# Patient Record
Sex: Male | Born: 1970
Health system: Southern US, Community
[De-identification: ages and names within clinical notes are randomized; demographics above are authoritative.]

## PROBLEM LIST (undated history)

## (undated) DIAGNOSIS — M1990 Unspecified osteoarthritis, unspecified site: Secondary | ICD-10-CM

## (undated) DIAGNOSIS — F411 Generalized anxiety disorder: Secondary | ICD-10-CM

## (undated) DIAGNOSIS — F1010 Alcohol abuse, uncomplicated: Secondary | ICD-10-CM

## (undated) DIAGNOSIS — K219 Gastro-esophageal reflux disease without esophagitis: Secondary | ICD-10-CM

## (undated) DIAGNOSIS — F32A Depression, unspecified: Secondary | ICD-10-CM

## (undated) DIAGNOSIS — F329 Major depressive disorder, single episode, unspecified: Secondary | ICD-10-CM

## (undated) HISTORY — DX: Generalized anxiety disorder: F41.1

## (undated) HISTORY — DX: Major depressive disorder, single episode, unspecified: F32.9

## (undated) HISTORY — DX: Unspecified osteoarthritis, unspecified site: M19.90

## (undated) HISTORY — PX: KNEE SURGERY: SHX244

## (undated) HISTORY — DX: Gastro-esophageal reflux disease without esophagitis: K21.9

## (undated) HISTORY — DX: Depression, unspecified: F32.A

## (undated) HISTORY — DX: Alcohol abuse, uncomplicated: F10.10

---

## 1998-05-22 ENCOUNTER — Emergency Department (HOSPITAL_COMMUNITY): Admission: EM | Admit: 1998-05-22 | Discharge: 1998-05-22 | Payer: Self-pay | Admitting: Emergency Medicine

## 1998-05-22 ENCOUNTER — Encounter: Payer: Self-pay | Admitting: Emergency Medicine

## 2000-05-20 ENCOUNTER — Emergency Department (HOSPITAL_COMMUNITY): Admission: EM | Admit: 2000-05-20 | Discharge: 2000-05-20 | Payer: Self-pay | Admitting: Emergency Medicine

## 2000-05-23 ENCOUNTER — Emergency Department (HOSPITAL_COMMUNITY): Admission: EM | Admit: 2000-05-23 | Discharge: 2000-05-23 | Payer: Self-pay | Admitting: Emergency Medicine

## 2000-05-30 ENCOUNTER — Emergency Department (HOSPITAL_COMMUNITY): Admission: EM | Admit: 2000-05-30 | Discharge: 2000-05-30 | Payer: Self-pay | Admitting: *Deleted

## 2002-09-16 ENCOUNTER — Emergency Department (HOSPITAL_COMMUNITY): Admission: EM | Admit: 2002-09-16 | Discharge: 2002-09-17 | Payer: Self-pay | Admitting: Emergency Medicine

## 2002-09-17 ENCOUNTER — Encounter: Payer: Self-pay | Admitting: Orthopedic Surgery

## 2002-09-20 ENCOUNTER — Encounter: Payer: Self-pay | Admitting: Orthopedic Surgery

## 2002-09-20 ENCOUNTER — Ambulatory Visit (HOSPITAL_BASED_OUTPATIENT_CLINIC_OR_DEPARTMENT_OTHER): Admission: RE | Admit: 2002-09-20 | Discharge: 2002-09-21 | Payer: Self-pay | Admitting: Orthopedic Surgery

## 2014-08-07 ENCOUNTER — Telehealth: Payer: Self-pay | Admitting: Behavioral Health

## 2014-08-07 NOTE — Telephone Encounter (Signed)
Unable to reach patient at time of Pre-Visit Call.  Left message for patient to return call when available.    

## 2014-08-08 ENCOUNTER — Encounter: Payer: Self-pay | Admitting: Medical

## 2014-08-08 ENCOUNTER — Ambulatory Visit (HOSPITAL_BASED_OUTPATIENT_CLINIC_OR_DEPARTMENT_OTHER)
Admission: RE | Admit: 2014-08-08 | Discharge: 2014-08-08 | Disposition: A | Discharge status: Home / Self Care | Payer: BLUE CROSS/BLUE SHIELD | Source: Ambulatory Visit | Attending: Medical | Admitting: Medical

## 2014-08-08 ENCOUNTER — Ambulatory Visit (INDEPENDENT_AMBULATORY_CARE_PROVIDER_SITE_OTHER): Payer: BLUE CROSS/BLUE SHIELD | Admitting: Medical

## 2014-08-08 ENCOUNTER — Telehealth: Payer: Self-pay | Admitting: *Deleted

## 2014-08-08 VITALS — BP 130/89 | HR 73 | Temp 97.7°F | Ht 72.5 in | Wt 192.6 lb

## 2014-08-08 DIAGNOSIS — R55 Syncope and collapse: Secondary | ICD-10-CM | POA: Insufficient documentation

## 2014-08-08 DIAGNOSIS — R5383 Other fatigue: Secondary | ICD-10-CM

## 2014-08-08 DIAGNOSIS — R0602 Shortness of breath: Secondary | ICD-10-CM | POA: Insufficient documentation

## 2014-08-08 DIAGNOSIS — R42 Dizziness and giddiness: Secondary | ICD-10-CM | POA: Diagnosis not present

## 2014-08-08 DIAGNOSIS — R0600 Dyspnea, unspecified: Secondary | ICD-10-CM

## 2014-08-08 DIAGNOSIS — F411 Generalized anxiety disorder: Secondary | ICD-10-CM

## 2014-08-08 DIAGNOSIS — R51 Headache: Secondary | ICD-10-CM

## 2014-08-08 DIAGNOSIS — R519 Headache, unspecified: Secondary | ICD-10-CM

## 2014-08-08 DIAGNOSIS — F1010 Alcohol abuse, uncomplicated: Secondary | ICD-10-CM | POA: Diagnosis not present

## 2014-08-08 HISTORY — DX: Alcohol abuse, uncomplicated: F10.10

## 2014-08-08 LAB — COMPREHENSIVE METABOLIC PANEL
ALBUMIN: 4.4 g/dL (ref 3.5–5.2)
ALT: 30 U/L (ref 0–53)
AST: 30 U/L (ref 0–37)
Alkaline Phosphatase: 67 U/L (ref 39–117)
BUN: 16 mg/dL (ref 6–23)
CHLORIDE: 105 meq/L (ref 96–112)
CO2: 26 mEq/L (ref 19–32)
Calcium: 9.6 mg/dL (ref 8.4–10.5)
Creatinine, Ser: 1.33 mg/dL (ref 0.40–1.50)
GFR: 61.6 mL/min (ref >60.00)
Glucose, Bld: 81 mg/dL (ref 70–99)
Potassium: 4.3 mEq/L (ref 3.5–5.1)
Sodium: 138 mEq/L (ref 135–145)
Total Bilirubin: 0.4 mg/dL (ref 0.2–1.2)
Total Protein: 7.3 g/dL (ref 6.0–8.3)

## 2014-08-08 LAB — CBC WITH DIFFERENTIAL/PLATELET
Basophils Absolute: 0 10*3/uL (ref 0.0–0.1)
Basophils Relative: 0.5 % (ref 0.0–3.0)
EOS ABS: 0.4 10*3/uL (ref 0.0–0.7)
EOS PCT: 4 % (ref 0.0–5.0)
HCT: 44.8 % (ref 39.0–52.0)
HEMOGLOBIN: 15.4 g/dL (ref 13.0–17.0)
Lymphocytes Relative: 23.3 % (ref 12.0–46.0)
Lymphs Abs: 2.2 10*3/uL (ref 0.7–4.0)
MCHC: 34.3 g/dL (ref 30.0–36.0)
MCV: 94.6 fl (ref 78.0–100.0)
MONOS PCT: 7.7 % (ref 3.0–12.0)
Monocytes Absolute: 0.7 10*3/uL (ref 0.1–1.0)
NEUTROS PCT: 64.5 % (ref 43.0–77.0)
Neutro Abs: 6 10*3/uL (ref 1.4–7.7)
Platelets: 246 10*3/uL (ref 150.0–400.0)
RBC: 4.73 Mil/uL (ref 4.22–5.81)
RDW: 13.2 % (ref 11.5–15.5)
WBC: 9.3 10*3/uL (ref 4.0–10.5)

## 2014-08-08 LAB — GAMMA GT: GGT: 52 U/L — AB (ref 7–51)

## 2014-08-08 LAB — TSH: TSH: 1.77 u[IU]/mL (ref 0.35–4.50)

## 2014-08-08 LAB — D-DIMER, QUANTITATIVE: D-Dimer, Quant: 0.27 ug/mL-FEU (ref 0.00–0.48)

## 2014-08-08 MED ORDER — VENLAFAXINE HCL ER 75 MG PO CP24: 75.0000 mg | ORAL_CAPSULE | Freq: Every day | ORAL | Status: DC

## 2014-08-08 NOTE — Assessment & Plan Note (Signed)
effexor rx 

## 2014-08-08 NOTE — Patient Instructions (Addendum)
Syncope 3 wks ago cardio and neuro referal. Get ekg today.  If get again then ED evaluation immediatlely.  Dizziness and giddiness Chronic/not new. Mild worse since syncope. In light of recent worsening, with ha and then syncope. Will try to get CT of head stat no contrast.  Headache Mild to moderate daily since syncope. Whereas in past was just twice a week. So will try to get ct of head stat.  Fatigue Cbc, cmp, tsh.  Dyspnea Since syncope. Will get cxr and d-dimer stat. If Positive may get ct of chest angio  Alcohol abuse Will get cmp and ggt  Generalized anxiety disorder effexor rx.    Follow up in one week or as needed

## 2014-08-08 NOTE — Progress Notes (Signed)
Subjective:    Patient ID: Christopher Pace, male    DOB: 08-03-1970, 44 y.o.   MRN: [id number]  HPI  I have reviewed pt PMH, PSH, FH, Social History and Surgical History.  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   Pt in states going through some MD recently. His former MD moved. Then his replacement he did not see eye to eye. Pt was on vyvanse in the past. Pt had drug test Cocaine and marijuana positive. Pt has not used those in a while. But former MD would not refill due to + drug screen  Pt was working Aeronautical engineerlandscaping. He states just fell out(13 days ago). After that happened he feels mild sob and has cough. But before this he was not sob. Nor any chest pain. Felt dizzy right before. Pt states syncope occurred maybe 6 minutes. He only guesses. This event was not witnesed. Pt did not get evaluated  Fatigue for 3 wks since syncopal event.   Pt has some arthritis left knee- hx of occasional cortisone shots in past.    Depression- not as bad as it was before.  Anxiety- pt states more anxious than depression. Daily low grade.  Gerd- on omeprazole. On for over a year.  Erectile dysfunction- pt uses cialis.  Dizziness- for 2 years. Usually will occur if he stands up quickly. But worse since the syncopal episode.   HA- occasional in past. But mild to moderate HA since the syncopal episode.  Substance use hx. Smoker- pack a day. Since early teens. Alcohol use.      Review of Systems   See hpi  Past Medical History  Diagnosis Date  . Arthritis   . Depression   . GERD (gastroesophageal reflux disease)   . Alcohol abuse 08/08/2014    History   Social History  . Marital Status: Divorced    Spouse Name: N/A  . Number of Children: N/A  . Years of Education: N/A   Occupational History  . Not on file.   Social History Main Topics  . Smoking status: Current Every Day Smoker    . Smokeless tobacco: Never Used  . Alcohol Use: 0.0 oz/week    0 Standard drinks or equivalent per week     Comment: Pt states 5 days a week some drinking. More than a six pack.  . Drug Use: Not on file  . Sexual Activity: Yes   Other Topics Concern  . Not on file   Social History Narrative  . No narrative on file    Past Surgical History  Procedure Laterality Date  . Knee surgery      Hardware Placed due yo break    No family history on file.  No Known Allergies  No current outpatient prescriptions on file prior to visit.   No current facility-administered medications on file prior to visit.    BP 130/89 mmHg  Pulse 73  Temp(Src) 97.7 F (36.5 C) (Oral)  Ht 6' 0.5" (1.842 m)  Wt 192 lb 9.6 oz (87.363 kg)  BMI 25.75 kg/m2  SpO2 98%        Objective:   Physical Exam  General Mental Status- Alert. General Appearance- Not in acute distress.   Skin General: Color- Normal Color. Moisture- Normal Moisture.  Neck Carotid Arteries- Normal color. Moisture- Normal Moisture. No carotid bruits. No JVD.  Chest and Lung Exam Auscultation: Breath Sounds:-Normal.  Cardiovascular Auscultation:Rythm- Regular. Murmurs & Other Heart Sounds:Auscultation of the heart reveals- No Murmurs.  Abdomen Inspection:-Inspeection Normal. Palpation/Percussion:Note:No mass. Palpation and Percussion of the abdomen reveal- Non Tender, Non Distended + BS, no rebound or guarding.    Neurologic Cranial Nerve exam:- CN III-XII intact(No nystagmus), symmetric smile. Drift Test:- No drift. Romberg Exam:- Negative.  Heal to Toe Gait exam:-Normal. Finger to Nose:- Normal/Intact Strength:- 5/5 equal and symmetric strength both upper and lower extremities.      Assessment & Plan:

## 2014-08-08 NOTE — Assessment & Plan Note (Signed)
Chronic/not new. Mild worse since syncope. In light of recent worsening, with ha and then syncope. Will try to get CT of head stat no contrast.

## 2014-08-08 NOTE — Assessment & Plan Note (Signed)
Cbc,cmp,tsh

## 2014-08-08 NOTE — Assessment & Plan Note (Signed)
Will get cmp and ggt

## 2014-08-08 NOTE — Assessment & Plan Note (Signed)
Since syncope. Will get cxr and d-dimer stat. If Positive may get ct of chest angio

## 2014-08-08 NOTE — Progress Notes (Signed)
Pre visit review using our clinic review tool, if applicable. No additional management support is needed unless otherwise documented below in the visit note. 

## 2014-08-08 NOTE — Assessment & Plan Note (Addendum)
3 wks ago cardio and neuro referal. Get ekg today.  If get again then ED evaluation immediatlely.

## 2014-08-08 NOTE — Assessment & Plan Note (Signed)
Mild to moderate daily since syncope. Whereas in past was just twice a week. So will try to get ct of head stat.

## 2014-08-08 NOTE — Telephone Encounter (Signed)
[facility] called to notify D-Dimer is normal at 0.27.

## 2014-08-26 ENCOUNTER — Ambulatory Visit: Payer: BLUE CROSS/BLUE SHIELD | Admitting: Cardiovascular Disease

## 2014-09-01 NOTE — Progress Notes (Signed)
Cardiology Office Note   Date:  09/02/2014   ID:  Christopher Pace, DOB Apr 06, 1970, MRN [id number]  PCP:  Esperanza Richters, PA-C  Cardiologist:   Madilyn Hook, MD   Chief Complaint  Patient presents with  . New Evaluation    SOB (smoking), pt states mainly when he feels anxious, has anxiety issues. Went to PCP for possible heat stroke, was working in heat and passed out ( a month ago)  . Fatigue    all the time  . Dizziness    vertigo, pt states it has been really bad lately      History of Present Illness: Christopher Pace is a 44 y.o. male with polysubstance abuse who presents for an evaluation of syncope.  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 He was pruning bushes and started to feel lightheaded and dizzy. The next thing he remembers he was laying on the ground. He is unclear how long he had passed out. When he awaken every 4 reporting water on his face. He was immediately aware of his surroundings and there was no post ictal confusion. There is no loss of bowel or bladder function. Prior to the episode he denied any chest pain, chest pressure, palpitations, or shortness of breath.  He tried to stay hydrated by drinking water. Christopher Pace denies any prior episodes of syncope.  Christopher Pace does not typically have much physical activity. He tries to eat a healthy diet. He smokes from half a pack to one pack per day and has not tried to quit. He occasionally uses illicit drugs.  He drinks up to 2 cases of beer in a week.  Christopher Pace was previously treated for ADHD with Vyvanse and Adderall.  However he went to [facility] for a bachelor party and participated in the use of illicit substances. After that he had a positive urine tox and his psychiatrist dismissed him from clinic. He is in the process of obtaining a new psychiatrist. In the meantime he is having a difficult time concentrating and thinking. He also  feels extremely fatigued and as though he can't do anything. All he wants to do his fall asleep. He is currently sleeping 6-8 hours per night.  Past Medical History  Diagnosis Date  . Arthritis   . Depression   . GERD (gastroesophageal reflux disease)   . Alcohol abuse 08/08/2014    Past Surgical History  Procedure Laterality Date  . Knee surgery      Hardware Placed due yo break     Current Outpatient Prescriptions  Medication Sig Dispense Refill  . meclizine (ANTIVERT) 25 MG tablet Take 25 mg by mouth 3 (three) times daily as needed for dizziness.    Marland Kitchen omeprazole (PRILOSEC) 40 MG capsule Take 40 mg by mouth.    . venlafaxine XR (EFFEXOR XR) 75 MG 24 hr capsule Take 1 capsule (75 mg total) by mouth daily with breakfast. 30 capsule 0   No current facility-administered medications for this visit.    Allergies:   Review of patient's allergies indicates no known allergies.    Social History:  The patient  reports that he has been smoking.  He has never used smokeless tobacco. He reports that he drinks alcohol.   Family History:  The patient's family history is not on file.    ROS:  Please see the history of present illness.  Otherwise, review of systems are positive for none.   All other systems are reviewed and negative.    PHYSICAL EXAM: VS:  BP 126/86 mmHg  Pulse 72  Ht  (1.854 m)  Wt 89.223 kg (196 lb 11.2 oz)  BMI 25.96 kg/m2 , BMI Body mass index is 25.96 kg/(m^2). GENERAL:  Well appearing HEENT:  Pupils equal round and reactive, fundi not visualized, oral mucosa unremarkable NECK:  No jugular venous distention, waveform within normal limits, carotid upstroke brisk and symmetric, no bruits, no thyromegaly LYMPHATICS:  No cervical adenopathy LUNGS:  Clear to auscultation bilaterally HEART:  RRR.  PMI not displaced or sustained,S1 and S2 within normal limits, no S3, no S4, no clicks, no rubs, no murmurs ABD:  Flat, positive bowel sounds normal in frequency in  pitch, no bruits, no rebound, no guarding, no midline pulsatile mass, no hepatomegaly, no splenomegaly EXT:  2 plus pulses throughout, no edema, no cyanosis no clubbing SKIN:  No rashes no nodules NEURO:  Cranial nerves II through XII grossly intact, motor grossly intact throughout PSYCH:  Cognitively intact, oriented to person place and time    EKG:  EKG is ordered today. The ekg ordered today demonstrates sinus rhythm at 72 bpm.  08/08/14: Sinus rhythm at 61 bpm  Recent Labs: 08/08/2014: ALT 30; BUN 16; Creatinine, Ser 1.33; Hemoglobin 15.4; Platelets 246.0; Potassium 4.3; Sodium 138; TSH 1.77   08/08/14: D-dimer 0.27  Lipid Panel No results found for: CHOL, TRIG, HDL, CHOLHDL, VLDL, LDLCALC, LDLDIRECT    Wt Readings from Last 3 Encounters:  09/02/14 89.223 kg (196 lb 11.2 oz)  08/08/14 87.363 kg (192 lb 9.6 oz)      Other studies Reviewed: Additional studies/ records that were reviewed today include: medical record . Review of the above records demonstrates:  Please see elsewhere in the note.     ASSESSMENT AND PLAN:  # Syncope: Christopher Pace episode of syncope was likely orthostatic hypotension in the setting of intravascular volume depletion. It was nearly 100 that day and although he was drinking water his intake was likely inadequate.  This may be exacerbated by his heavy alcohol use. Given that it occurred during a period of exertion and he does not otherwise typically exert herself, we will pursue an ischemia evaluation. His primary care physician has already obtained baseline labs that revealed no electrolyte abnormalities, negative d-dimer, and normal TSH. - treadmill exercise stress test  # CV disease prevention: Will check lipids today as a screening evaluation.  # EtOH abuse: Christopher Pace alcohol use is above the recommended intake of no more than 14 drinks in a week and no more than 2 in one sitting. He was advised of these recommendations and is planning to try and  cut back. He admits that this may be challenging for him to do until his ADHD is under better control. I recommended that he work to find any psychiatrist quickly.  # Tobacco abuse: Patient was offered assistance with smoking cessation. As above he is unable to do so until his ADHD is under better control. He is working to find a new psychiatrist and we will be happy to assist him with tobacco cessation when he is ready.  We discussed tobacco cessation for 5 minutes and he is not ready to take steps towards cessation at this time.  Current medicines are reviewed at length with the patient today.  The patient has concerns regarding medicines.  The following changes have been made:  no  change  Labs/ tests ordered today include: lipid panel, ETT   Orders Placed This Encounter  Procedures  . Lipid panel  . Myocardial Perfusion Imaging  . EKG 12-Lead     Disposition:   FU with Dr. Elmarie Shiley C. Duke Salvia prn    Signed, Madilyn Hook, MD  09/02/2014 10:31 AM    [facility]

## 2014-09-02 ENCOUNTER — Encounter: Payer: Self-pay | Admitting: Cardiovascular Disease

## 2014-09-02 ENCOUNTER — Ambulatory Visit (INDEPENDENT_AMBULATORY_CARE_PROVIDER_SITE_OTHER): Payer: BLUE CROSS/BLUE SHIELD | Admitting: Cardiovascular Disease

## 2014-09-02 VITALS — BP 126/86 | HR 72 | Ht 73.0 in | Wt 196.7 lb

## 2014-09-02 DIAGNOSIS — Z79899 Other long term (current) drug therapy: Secondary | ICD-10-CM | POA: Diagnosis not present

## 2014-09-02 DIAGNOSIS — R42 Dizziness and giddiness: Secondary | ICD-10-CM | POA: Diagnosis not present

## 2014-09-02 DIAGNOSIS — R55 Syncope and collapse: Secondary | ICD-10-CM

## 2014-09-02 DIAGNOSIS — Z13220 Encounter for screening for lipoid disorders: Secondary | ICD-10-CM | POA: Diagnosis not present

## 2014-09-02 NOTE — Patient Instructions (Signed)
Your physician has requested that you have en exercise stress myoview. For further information please visit [email or web address] Please follow instruction sheet, as given.  Labs- lipid - nothing to eat or drink the morning of the test.  Will contact with results Your physician recommends that you schedule a follow-up appointment on an as needed basis.

## 2014-09-03 LAB — LIPID PANEL
CHOLESTEROL: 233 mg/dL — AB (ref 125–200)
HDL: 48 mg/dL (ref >=40)
TRIGLYCERIDES: 592 mg/dL — AB (ref <150)
Total CHOL/HDL Ratio: 4.9 Ratio (ref <=5.0)

## 2014-09-03 LAB — LDL CHOLESTEROL, DIRECT: LDL DIRECT: 103 mg/dL (ref <130)

## 2014-09-08 ENCOUNTER — Other Ambulatory Visit: Payer: Self-pay | Admitting: *Deleted

## 2014-09-08 DIAGNOSIS — E785 Hyperlipidemia, unspecified: Secondary | ICD-10-CM

## 2014-09-08 DIAGNOSIS — Z79899 Other long term (current) drug therapy: Secondary | ICD-10-CM

## 2014-09-08 NOTE — Telephone Encounter (Signed)
-----   Message from Chilton Si, MD sent at 09/04/2014  1:32 PM EDT ----- Triglycerides are very elevated and total cholesterol is elevated.  This is likely due to heavy alcohol consumption.  Limit alcohol to no more than 2 drinks per day and no more than 14 in a week.  Avoid sugary food and fatty foods.  Start atorvastatin  and come back for office visit, LFTs, and lipids in 3 months.

## 2014-09-08 NOTE — Telephone Encounter (Signed)
LEFT MESSAGE TO CALL BACK- IN REGARDS LABS AND UPCOMING  OFFICE APPOINTMENT

## 2014-09-12 ENCOUNTER — Telehealth (HOSPITAL_COMMUNITY): Payer: Self-pay

## 2014-09-12 NOTE — Telephone Encounter (Signed)
Encounter complete. 

## 2014-09-17 ENCOUNTER — Ambulatory Visit (HOSPITAL_COMMUNITY): Admission: RE | Admit: 2014-09-17 | Payer: BLUE CROSS/BLUE SHIELD | Source: Ambulatory Visit

## 2014-09-18 NOTE — Telephone Encounter (Signed)
This is not a medication prescribed by cardiology.  Please ask Christopher Pace to follow up with wither his PCP or urology.

## 2014-09-18 NOTE — Telephone Encounter (Signed)
-----   Message from Chilton Si, MD sent at 09/04/2014  1:32 PM EDT ----- Triglycerides are very elevated and total cholesterol is elevated.  This is likely due to heavy alcohol consumption.  Limit alcohol to no more than 2 drinks per day and no more than 14 in a week.  Avoid sugary food and fatty foods.  Start atorvastatin  and come back for office visit, LFTs, and lipids in 3 months.

## 2014-09-18 NOTE — Telephone Encounter (Signed)
Spoke to patient. Result given . Verbalized understanding  Patient  Would like to know if Cialis can be prescribed for him RN informed patient will defer to Dr Duke Salvia and contact him .

## 2014-09-19 MED ORDER — ATORVASTATIN CALCIUM 40 MG PO TABS: 40.0000 mg | ORAL_TABLET | Freq: Every day | ORAL | Status: DC

## 2014-09-19 NOTE — Telephone Encounter (Signed)
LEFT MESSAGE ON VOICE MAIL  WILL NEED PRIMARY TO FILL CIALIS.

## 2014-09-25 ENCOUNTER — Ambulatory Visit: Payer: BLUE CROSS/BLUE SHIELD | Admitting: Neurology

## 2014-09-26 ENCOUNTER — Telehealth (HOSPITAL_COMMUNITY): Payer: Self-pay | Admitting: *Deleted

## 2014-10-01 ENCOUNTER — Telehealth (HOSPITAL_COMMUNITY): Payer: Self-pay | Admitting: *Deleted

## 2014-12-25 ENCOUNTER — Telehealth: Payer: Self-pay | Admitting: *Deleted

## 2014-12-25 DIAGNOSIS — E785 Hyperlipidemia, unspecified: Secondary | ICD-10-CM

## 2014-12-25 DIAGNOSIS — Z79899 Other long term (current) drug therapy: Secondary | ICD-10-CM

## 2014-12-25 NOTE — Telephone Encounter (Signed)
-----   Message from Tobin ChadSharon Milderd Manocchio V, RN sent at 09/19/2014  9:04 AM EDT ----- LABS IN DEC 2016 - LIPID ,LIVER MAIL IN NOV 2016

## 2014-12-25 NOTE — Telephone Encounter (Signed)
Mail letter, lab slip-- lipid ,liver

## 2014-12-25 NOTE — Telephone Encounter (Signed)
Open error 

## 2015-09-14 ENCOUNTER — Ambulatory Visit (HOSPITAL_BASED_OUTPATIENT_CLINIC_OR_DEPARTMENT_OTHER)
Admission: RE | Admit: 2015-09-14 | Discharge: 2015-09-14 | Disposition: A | Discharge status: Home / Self Care | Payer: BLUE CROSS/BLUE SHIELD | Source: Ambulatory Visit | Attending: Medical | Admitting: Medical

## 2015-09-14 ENCOUNTER — Encounter: Payer: Self-pay | Admitting: Medical

## 2015-09-14 ENCOUNTER — Telehealth: Payer: Self-pay | Admitting: Medical

## 2015-09-14 ENCOUNTER — Ambulatory Visit (INDEPENDENT_AMBULATORY_CARE_PROVIDER_SITE_OTHER): Payer: BLUE CROSS/BLUE SHIELD | Admitting: Medical

## 2015-09-14 VITALS — HR 82 | Temp 98.7°F | Ht 73.0 in | Wt 215.0 lb

## 2015-09-14 DIAGNOSIS — J984 Other disorders of lung: Secondary | ICD-10-CM | POA: Diagnosis not present

## 2015-09-14 DIAGNOSIS — L744 Anhidrosis: Secondary | ICD-10-CM | POA: Diagnosis not present

## 2015-09-14 DIAGNOSIS — R5383 Other fatigue: Secondary | ICD-10-CM | POA: Diagnosis not present

## 2015-09-14 DIAGNOSIS — N529 Male erectile dysfunction, unspecified: Secondary | ICD-10-CM

## 2015-09-14 DIAGNOSIS — Z720 Tobacco use: Secondary | ICD-10-CM | POA: Insufficient documentation

## 2015-09-14 DIAGNOSIS — R911 Solitary pulmonary nodule: Secondary | ICD-10-CM

## 2015-09-14 DIAGNOSIS — J309 Allergic rhinitis, unspecified: Secondary | ICD-10-CM | POA: Diagnosis not present

## 2015-09-14 DIAGNOSIS — Z87891 Personal history of nicotine dependence: Secondary | ICD-10-CM

## 2015-09-14 DIAGNOSIS — R918 Other nonspecific abnormal finding of lung field: Secondary | ICD-10-CM | POA: Diagnosis not present

## 2015-09-14 LAB — CBC WITH DIFFERENTIAL/PLATELET
Basophils Absolute: 0.1 10*3/uL (ref 0.0–0.1)
Basophils Relative: 0.6 % (ref 0.0–3.0)
EOS PCT: 3 % (ref 0.0–5.0)
Eosinophils Absolute: 0.3 10*3/uL (ref 0.0–0.7)
HCT: 45.5 % (ref 39.0–52.0)
Hemoglobin: 15.6 g/dL (ref 13.0–17.0)
LYMPHS ABS: 2.2 10*3/uL (ref 0.7–4.0)
Lymphocytes Relative: 21.7 % (ref 12.0–46.0)
MCHC: 34.2 g/dL (ref 30.0–36.0)
MCV: 92.8 fl (ref 78.0–100.0)
Monocytes Absolute: 0.8 10*3/uL (ref 0.1–1.0)
Monocytes Relative: 7.6 % (ref 3.0–12.0)
NEUTROS PCT: 67.1 % (ref 43.0–77.0)
Neutro Abs: 6.7 10*3/uL (ref 1.4–7.7)
Platelets: 254 10*3/uL (ref 150.0–400.0)
RBC: 4.9 Mil/uL (ref 4.22–5.81)
RDW: 12.9 % (ref 11.5–15.5)
WBC: 10 10*3/uL (ref 4.0–10.5)

## 2015-09-14 LAB — TSH: TSH: 1.12 u[IU]/mL (ref 0.35–4.50)

## 2015-09-14 LAB — COMPREHENSIVE METABOLIC PANEL
ALK PHOS: 59 U/L (ref 39–117)
ALT: 46 U/L (ref 0–53)
AST: 25 U/L (ref 0–37)
Albumin: 4.6 g/dL (ref 3.5–5.2)
BUN: 14 mg/dL (ref 6–23)
CALCIUM: 10.5 mg/dL (ref 8.4–10.5)
CO2: 25 mEq/L (ref 19–32)
Chloride: 101 mEq/L (ref 96–112)
Creatinine, Ser: 1.13 mg/dL (ref 0.40–1.50)
GFR: 73.99 mL/min (ref >60.00)
GLUCOSE: 100 mg/dL — AB (ref 70–99)
POTASSIUM: 4.6 meq/L (ref 3.5–5.1)
Sodium: 136 mEq/L (ref 135–145)
TOTAL PROTEIN: 7.5 g/dL (ref 6.0–8.3)
Total Bilirubin: 0.4 mg/dL (ref 0.2–1.2)

## 2015-09-14 LAB — POC URINALSYSI DIPSTICK (AUTOMATED)
BILIRUBIN UA: NEGATIVE
Blood, UA: NEGATIVE
Glucose, UA: NEGATIVE
KETONES UA: NEGATIVE
LEUKOCYTES UA: NEGATIVE
Nitrite, UA: NEGATIVE
Protein, UA: NEGATIVE
Spec Grav, UA: >=1.03
Urobilinogen, UA: 0.2
pH, UA: 5.5

## 2015-09-14 MED ORDER — OMEPRAZOLE 40 MG PO CPDR: 40.0000 mg | DELAYED_RELEASE_CAPSULE | Freq: Every day | ORAL | 3 refills | Status: DC

## 2015-09-14 MED ORDER — FLUTICASONE PROPIONATE 50 MCG/ACT NA SUSP
2.0000 | Freq: Every day | NASAL | 5 refills | Status: DC
Start: 2015-09-14 — End: 2015-11-04

## 2015-09-14 MED ORDER — TADALAFIL 20 MG PO TABS: 20.0000 mg | ORAL_TABLET | Freq: Every day | ORAL | 0 refills | Status: DC | PRN

## 2015-09-14 NOTE — Progress Notes (Signed)
Pre visit review using our clinic tool,if applicable. No additional management support is needed unless otherwise documented below in the visit note.  

## 2015-09-14 NOTE — Telephone Encounter (Signed)
Ct chest without contrast order placed. Would you schedule.

## 2015-09-14 NOTE — Patient Instructions (Addendum)
For your fatigue will get cbc, cmp and tsh.  For allergic rhinitis and nasal congestion will rx flonase.  For history of smoking will get chest xray.   For ED refill of your cialis.   For reflux rx omeprazole.  Regarding your decreased sweating would recommend stopping drinking alcohol  completely and we will do labs and cxr today. Specialized testing may need to be done. Keep in cool environements and hydrate with propel pending labs. May need to refer to specalist for specialized testing  Follow up  Date to be determined after labs and cxr back.

## 2015-09-14 NOTE — Progress Notes (Signed)
Subjective:    Patient ID: Christopher Pace, male    DOB: 1970/12/07, 45 y.o.   MRN: [id number]  HPI  Pt in for evaluation.   Pt states he has some complaints. He thinks he is not sweating much. 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 Pt states used to drink alcohol every day. Was drinking 12 pack almost every day. Now about only glass of wine a day. He also cut back on smoking. About pack every 3 days. Pt does smoke some occasional marijuana.   Pt has some allergies and sinus problems in the past. He used to be on nose spray but has run out. Request refill  Pt also states his heartburn symptoms. Pt prescription ran out. He belches a lot and feels burn in his throat. Epigastric discomfort. This is worst past week.  Pt also has some ED. He wants refill of his cialis.   Pt states previous severe dizzines/light headed has almost resolved. In past he was describing near syncope. Etiology was undetermined and he was referred to cardiologist(since he reported almost near syncope) but he missed that appointment. Did not have the time per his report. (though he does describe some positional vertigo if he lies flat on his back and turns head.). 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  Pt does report some fatigue.On review he denies snoring    Review of Systems  Constitutional: Positive for fatigue. Negative for chills and fever.  HENT: Positive for congestion. Negative for facial swelling, nosebleeds, sinus pressure, sneezing and tinnitus.   Respiratory: Negative for cough, chest tightness, shortness of breath and wheezing.   Cardiovascular: Negative for chest pain and palpitations.  Gastrointestinal: Positive for abdominal pain. Negative for nausea and vomiting.       Genella RifeGerd. Heart burn.  Endocrine:       Decreased sweating.  Genitourinary: Negative for dysuria and frequency.       ED.  Skin: Negative for rash.  Neurological: Negative for dizziness.       None  now but see hpi.  Hematological: Negative for adenopathy. Does not bruise/bleed easily.  Psychiatric/Behavioral: Negative for behavioral problems and confusion.    Past Medical History:  Diagnosis Date  . Alcohol abuse 08/08/2014  . Arthritis   . Depression   . GERD (gastroesophageal reflux disease)      Social History   Social History  . Marital status: Divorced    Spouse name: N/A  . Number of children: N/A  . Years of education: N/A   Occupational History  . Not on file.   Social History Main Topics  . Smoking status: Current Every Day Smoker  . Smokeless tobacco: Never Used  . Alcohol use 0.0 oz/week     Comment: Pt states 5 days a week some drinking. More than a six pack.  . Drug use: Unknown  . Sexual activity: Yes   Other Topics Concern  . Not on file   Social History Narrative  . No narrative on file    Past Surgical History:  Procedure Laterality Date  . KNEE SURGERY     Hardware Placed due yo break    No family history on file.  No Known Allergies  Current Outpatient Prescriptions on File Prior to Visit  Medication Sig Dispense Refill  . atorvastatin (LIPITOR) 40 MG tablet Take 1 tablet (40 mg total) by mouth daily. 90 tablet 2  . meclizine (ANTIVERT) 25 MG tablet Take  25 mg by mouth 3 (three) times daily as needed for dizziness.    Marland Kitchen. omeprazole (PRILOSEC) 40 MG capsule Take 40 mg by mouth.    . venlafaxine XR (EFFEXOR XR) 75 MG 24 hr capsule Take 1 capsule (75 mg total) by mouth daily with breakfast. 30 capsule 0   No current facility-administered medications on file prior to visit.     Pulse 82   Temp 98.7 F (37.1 C)   Ht 6\' 1"  (1.854 m)   Wt 215 lb (97.5 kg)   SpO2 98%   BMI 28.37 kg/m       Objective:   Physical Exam  General Mental Status- Alert. General Appearance- Not in acute distress.   Skin General: Color- Normal Color. Moisture- Normal Moisture.  Neck Carotid Arteries- Normal color. Moisture- Normal Moisture. No  carotid bruits. No JVD. No thyroomegaly.  Chest and Lung Exam Auscultation: Breath Sounds:-Normal.  Cardiovascular Auscultation:Rythm- Regular. Murmurs & Other Heart Sounds:Auscultation of the heart reveals- No Murmurs.  Abdomen Inspection:-Inspeection Normal. Palpation/Percussion:Note:No mass. Palpation and Percussion of the abdomen reveal- Non Tender, Non Distended + BS, no rebound or guarding.    Neurologic Cranial Nerve exam:- CN III-XII intact(No nystagmus), symmetric smile. Strength:- 5/5 equal and symmetric strength both upper and lower extremities.     Assessment & Plan:  For your fatigue will get cbc, cmp and tsh.  For allergic rhinitis and nasal congestion will rx flonase.  For history of smoking will get chest xray.   For ED refill of your cialis.   For reflux rx omeprazole.  Regarding your decreased sweating would recommend stopping drinking completely and we will do labs and cxr today. Specialized testing may need to be done.  After labs back may need to send to specialist for axon reflex test, sialastic sweat imprint test, skin biopsy or thermoregulatory sweat test. Differential dx regarding cause of lack of sweat or decreased ross syndrome, daibetes. Alcoholism, parkinsons, amyloidosis, sjogren syndrome, small cell lung CA, fabry disease or horners syndrome.   Follow up  Date to be determined after labs and cxr back.

## 2015-09-15 ENCOUNTER — Ambulatory Visit (HOSPITAL_BASED_OUTPATIENT_CLINIC_OR_DEPARTMENT_OTHER)
Admission: RE | Admit: 2015-09-15 | Discharge: 2015-09-15 | Disposition: A | Discharge status: Home / Self Care | Payer: BLUE CROSS/BLUE SHIELD | Source: Ambulatory Visit | Attending: Medical | Admitting: Medical

## 2015-09-15 DIAGNOSIS — I2510 Atherosclerotic heart disease of native coronary artery without angina pectoris: Secondary | ICD-10-CM | POA: Diagnosis not present

## 2015-09-15 DIAGNOSIS — R911 Solitary pulmonary nodule: Secondary | ICD-10-CM | POA: Insufficient documentation

## 2015-09-15 DIAGNOSIS — R918 Other nonspecific abnormal finding of lung field: Secondary | ICD-10-CM | POA: Diagnosis not present

## 2015-09-15 NOTE — Telephone Encounter (Signed)
Order has been sent to [facility] for scheduling. [facility] auth [facility] Ref# [id number] U. Thanks

## 2015-09-16 ENCOUNTER — Telehealth: Payer: Self-pay | Admitting: Medical

## 2015-09-16 NOTE — Addendum Note (Signed)
Addended by: Gwenevere AbbotSAGUIER, Julissa Browning M on: 09/16/2015 10:38 AM   Modules accepted: Orders

## 2015-09-16 NOTE — Telephone Encounter (Signed)
When you send records over to dermatologist would you also send the cxr report. Prior granulomatous finding in spleen may be important finding in work up. Would like derm to be aware.

## 2015-09-16 NOTE — Telephone Encounter (Signed)
referal to derm placed for reduced sweating.

## 2015-09-16 NOTE — Telephone Encounter (Signed)
Report faxed.

## 2015-09-17 ENCOUNTER — Telehealth: Payer: Self-pay | Admitting: Medical

## 2015-09-17 NOTE — Telephone Encounter (Signed)
°  Relationship to patient: Self  Can be reached: [phone]    Reason for call: Patient called to check the status of the PA for his Cialis. States the pharmacy sent a request via fax and he has not heard anything.

## 2015-09-22 NOTE — Telephone Encounter (Signed)
Pt's wife Karoline Caldwell(Angie) states is needing preauthorization sent to [facility] Tel [phone] for pt to received his meds for tadalafil (CIALIS) 20 MG tablet. Pt's wife states they are leaving the country on Monday and is needing this processed ASAP. Please advise.

## 2015-09-23 NOTE — Telephone Encounter (Signed)
Message routed to [facility].    Please advise.

## 2015-10-29 ENCOUNTER — Telehealth: Payer: Self-pay | Admitting: Medical

## 2015-10-29 NOTE — Telephone Encounter (Signed)
tadalafil (CIALIS) 20 MG tablet  omeprazole (PRILOSEC) 40 MG capsule  fluticasone (FLONASE) 50 MCG/ACT nasal spray   Patient's fiance (angie) is calling to get these medications sent to [facility]. She is frustrated because she had been given the run around on getting these medications sent in. Also, the patient has not gotten the Cialis at all because it needed a PAR and the patient's wife says that it still hasn't gotten done according to the pharmacy. Please advise.   Patient Phone: [phone] Patient Relation: Fiance Caller: Angie

## 2015-11-04 ENCOUNTER — Other Ambulatory Visit: Payer: Self-pay

## 2015-11-04 MED ORDER — TADALAFIL 20 MG PO TABS: 20.0000 mg | ORAL_TABLET | Freq: Every day | ORAL | 0 refills | Status: DC | PRN

## 2015-11-04 MED ORDER — FLUTICASONE PROPIONATE 50 MCG/ACT NA SUSP
2.0000 | Freq: Every day | NASAL | 5 refills | Status: DC
Start: 2015-11-04 — End: 2016-09-02

## 2015-11-04 MED ORDER — OMEPRAZOLE 40 MG PO CPDR: 40.0000 mg | DELAYED_RELEASE_CAPSULE | Freq: Every day | ORAL | 3 refills | Status: DC

## 2015-11-04 NOTE — Telephone Encounter (Signed)
Refilled medications for patient

## 2015-11-10 ENCOUNTER — Telehealth: Payer: Self-pay | Admitting: Medical

## 2015-11-10 MED ORDER — SILDENAFIL CITRATE 100 MG PO TABS: 100.0000 mg | ORAL_TABLET | Freq: Every day | ORAL | 0 refills | Status: DC | PRN

## 2015-11-10 NOTE — Telephone Encounter (Signed)
I called pt and discussed with him. Will fill out form and send viagra during the interim to his local pharmacy during the interim.

## 2015-11-10 NOTE — Telephone Encounter (Signed)
Spouse called in, she says that she is starting to get upset because she says that the process is taking time. I spoke with nurse for provider and was advised that she would look into things and give spouse a call back directly.   CB: [phone]

## 2015-11-10 NOTE — Telephone Encounter (Signed)
I don't remember seeing any note sent to me recently 10-29-2015  or 11-04-2015 note. Were they sent to me. First actual prior authorization letter that I have seen was on 11-04-2015. I can send him in some  Generic viagra. [facility] since [facility] have cheaper prices pending cialis prior authorization. His insurance wants to know if he has every tried cialis 2.5 mg one tablet a day. Has he every used cialis low dose daily.   Let me know if he agrees with sending generic pending the prior authorization. [facility] [place] but they [place] prices.

## 2015-11-11 ENCOUNTER — Telehealth: Payer: Self-pay | Admitting: Medical

## 2015-11-11 NOTE — Telephone Encounter (Signed)
I filled pt prior authorization form. Placed on your desk.. Will you fax over and keep fax confirmation. Thanks.

## 2015-11-12 NOTE — Telephone Encounter (Signed)
Paperwork was faxed on 11/12/15 and fax confirmation received.

## 2015-11-18 NOTE — Telephone Encounter (Signed)
Called and spoke with the pt on (Monday-11/16/15)  to see if he had received a letter from [facility] regarding the Cialis.  Pt stated that he has not received any letter.  Informed the pt that we received a letter an it  was to inform us that they are unable to approve the request for the Cialis 20mg ,  The letter stated that coverage for the additional drug quantities for the diagnosis of erectile dysfunction is provided for the 5mg  tablet of Cialis.  Coverage will continue with the plan's base limit quantity.  Informed the pt that the Cialis 20mg  tablet is covered for a maximum quantity of 8 tablets or 24 tablets per claim, depending on the base day supply allowance of your plan.  Pt stated that he went to the pharmacy and picked up a prescription.  Asked the pt which prescription did he receive and he stated that he received the Cialis 20mg .  He stated that he was given 8 tablets.  Pt stated that 8 tablets was enough for him to have, and he does not need any more then that.  Informed Ramon Dredgedward of the letter from [employer] [facility], and he stated that he gave the pt a prescription for Viagra until we heard back on the PA.  Per Ramon DredgeEdward please let the pt know that he is to take either the Cialis or the Viagra.  He is not to take both.  Informed the pt of Edward's message and pt verbalized understanding and agreed.  Stated to the pt again of Edward's message to take one or the other.  Pt again agreed.//AB/CMA

## 2016-01-03 ENCOUNTER — Other Ambulatory Visit: Payer: Self-pay | Admitting: Medical

## 2016-01-04 NOTE — Telephone Encounter (Signed)
Notify pt that cialis was sent to his pharmacy. If they fill then dc use of viagra.

## 2016-01-04 NOTE — Telephone Encounter (Signed)
Patient is requesting Cialis which is not currently on his med list. Patient has Viagra listed as a medication for erectile dysfunction. Please advise.  PC

## 2016-01-06 NOTE — Telephone Encounter (Signed)
Patient notified

## 2016-03-02 ENCOUNTER — Other Ambulatory Visit: Payer: Self-pay | Admitting: Medical

## 2016-03-08 NOTE — Telephone Encounter (Signed)
Refilled pt cialis

## 2016-03-22 ENCOUNTER — Other Ambulatory Visit: Payer: Self-pay | Admitting: Medical

## 2016-03-28 NOTE — Telephone Encounter (Signed)
Sent in refill of omeprazole to his pharmacy.

## 2016-06-06 ENCOUNTER — Other Ambulatory Visit: Payer: Self-pay | Admitting: Medical

## 2016-06-09 NOTE — Telephone Encounter (Signed)
Notified pt PCP want him to follow up sometime this summer early morning appointment. Pt was agreeable states he will give me a call back to schedule appointment.

## 2016-06-09 NOTE — Telephone Encounter (Signed)
I refilled is cialis but call him and please advise/recommend follow up. On review of labs in 2016 he had quite high triglycerides and cholesterol. I don't see that this has been checked. So some time this summer recommend he make appointment for early am. Fasting lipid panel and get metabolic panel. Document he was advised. Offer appointment. Let me know if he declines.

## 2016-06-09 NOTE — Telephone Encounter (Signed)
Would should pt have follow up? Hasn't been seen since 09/14/15. Please advise.

## 2016-07-03 ENCOUNTER — Other Ambulatory Visit: Payer: Self-pay | Admitting: Medical

## 2016-09-02 ENCOUNTER — Ambulatory Visit (INDEPENDENT_AMBULATORY_CARE_PROVIDER_SITE_OTHER): Payer: BLUE CROSS/BLUE SHIELD | Admitting: Medical

## 2016-09-02 ENCOUNTER — Telehealth: Payer: Self-pay | Admitting: Medical

## 2016-09-02 ENCOUNTER — Encounter: Payer: Self-pay | Admitting: Medical

## 2016-09-02 VITALS — BP 130/80 | HR 78 | Temp 98.2°F | Resp 16 | Ht 72.0 in | Wt 224.0 lb

## 2016-09-02 DIAGNOSIS — Z113 Encounter for screening for infections with a predominantly sexual mode of transmission: Secondary | ICD-10-CM | POA: Diagnosis not present

## 2016-09-02 DIAGNOSIS — R7989 Other specified abnormal findings of blood chemistry: Secondary | ICD-10-CM | POA: Diagnosis not present

## 2016-09-02 DIAGNOSIS — R5383 Other fatigue: Secondary | ICD-10-CM | POA: Diagnosis not present

## 2016-09-02 DIAGNOSIS — F17200 Nicotine dependence, unspecified, uncomplicated: Secondary | ICD-10-CM

## 2016-09-02 DIAGNOSIS — Z0000 Encounter for general adult medical examination without abnormal findings: Secondary | ICD-10-CM

## 2016-09-02 LAB — COMPREHENSIVE METABOLIC PANEL
ALT: 68 U/L — ABNORMAL HIGH (ref 0–53)
AST: 33 U/L (ref 0–37)
Albumin: 4.9 g/dL (ref 3.5–5.2)
Alkaline Phosphatase: 58 U/L (ref 39–117)
BUN: 15 mg/dL (ref 6–23)
CHLORIDE: 99 meq/L (ref 96–112)
CO2: 29 mEq/L (ref 19–32)
CREATININE: 1.19 mg/dL (ref 0.40–1.50)
Calcium: 10 mg/dL (ref 8.4–10.5)
GFR: 69.41 mL/min (ref >60.00)
Glucose, Bld: 88 mg/dL (ref 70–99)
Potassium: 4.8 mEq/L (ref 3.5–5.1)
SODIUM: 136 meq/L (ref 135–145)
Total Bilirubin: 0.6 mg/dL (ref 0.2–1.2)
Total Protein: 8.1 g/dL (ref 6.0–8.3)

## 2016-09-02 LAB — CBC WITH DIFFERENTIAL/PLATELET
BASOS PCT: 0.8 % (ref 0.0–3.0)
Basophils Absolute: 0.1 10*3/uL (ref 0.0–0.1)
EOS ABS: 0.2 10*3/uL (ref 0.0–0.7)
Eosinophils Relative: 2.1 % (ref 0.0–5.0)
HCT: 44.4 % (ref 39.0–52.0)
HEMOGLOBIN: 15 g/dL (ref 13.0–17.0)
Lymphocytes Relative: 26 % (ref 12.0–46.0)
Lymphs Abs: 2.5 10*3/uL (ref 0.7–4.0)
MCHC: 33.8 g/dL (ref 30.0–36.0)
MCV: 95 fl (ref 78.0–100.0)
MONO ABS: 0.8 10*3/uL (ref 0.1–1.0)
Monocytes Relative: 8.7 % (ref 3.0–12.0)
Neutro Abs: 5.9 10*3/uL (ref 1.4–7.7)
Neutrophils Relative %: 62.4 % (ref 43.0–77.0)
PLATELETS: 244 10*3/uL (ref 150.0–400.0)
RBC: 4.68 Mil/uL (ref 4.22–5.81)
RDW: 13.2 % (ref 11.5–15.5)
WBC: 9.4 10*3/uL (ref 4.0–10.5)

## 2016-09-02 LAB — POC URINALSYSI DIPSTICK (AUTOMATED)
Bilirubin, UA: NEGATIVE
Blood, UA: NEGATIVE
Glucose, UA: NEGATIVE
Ketones, UA: NEGATIVE
Leukocytes, UA: NEGATIVE
NITRITE UA: NEGATIVE
PH UA: 6 (ref 5.0–8.0)
PROTEIN UA: NEGATIVE
Spec Grav, UA: 1.025 (ref 1.010–1.025)
UROBILINOGEN UA: 0.2 U/dL

## 2016-09-02 LAB — LIPID PANEL
CHOL/HDL RATIO: 4
Cholesterol: 218 mg/dL — ABNORMAL HIGH (ref 0–200)
HDL: 51.4 mg/dL (ref >39.00)
Triglycerides: 452 mg/dL — ABNORMAL HIGH (ref 0.0–149.0)

## 2016-09-02 LAB — LDL CHOLESTEROL, DIRECT: LDL DIRECT: 105 mg/dL

## 2016-09-02 LAB — TSH: TSH: 1.16 u[IU]/mL (ref 0.35–4.50)

## 2016-09-02 MED ORDER — OMEPRAZOLE 40 MG PO CPDR: DELAYED_RELEASE_CAPSULE | ORAL | 6 refills | Status: DC

## 2016-09-02 MED ORDER — BUPROPION HCL ER (SR) 150 MG PO TB12: ORAL_TABLET | ORAL | 1 refills | Status: DC

## 2016-09-02 MED ORDER — MECLIZINE HCL 25 MG PO TABS: 25.0000 mg | ORAL_TABLET | Freq: Three times a day (TID) | ORAL | 0 refills | Status: DC | PRN

## 2016-09-02 MED ORDER — TADALAFIL 20 MG PO TABS: 20.0000 mg | ORAL_TABLET | Freq: Every day | ORAL | 3 refills | Status: DC | PRN

## 2016-09-02 MED ORDER — FLUTICASONE PROPIONATE 50 MCG/ACT NA SUSP: 2.0000 | Freq: Every day | NASAL | 5 refills | Status: DC

## 2016-09-02 MED ORDER — ATORVASTATIN CALCIUM 40 MG PO TABS: 40.0000 mg | ORAL_TABLET | Freq: Every day | ORAL | 1 refills | Status: DC

## 2016-09-02 NOTE — Patient Instructions (Addendum)
For you wellness exam today I have ordered cbc, cmp, tsh, lipid panel, ua and hiv.  Vaccine given tdap  Recommend exercise and healthy diet.  We will let you know lab results as they come in.  For smoking cessation wellbutrin. May benefit attention as well.  Follow up date appointment will be determined after lab review.   Recommend cutting back further on alcohol but also good job on reduction so far.   Preventive Care 40-64 Years, Male Preventive care refers to lifestyle choices and visits with your health care provider that can promote health and wellness. What does preventive care include?  A yearly physical exam. This is also called an annual well check.  Dental exams once or twice a year.  Routine eye exams. Ask your health care provider how often you should have your eyes checked.  Personal lifestyle choices, including: ? Daily care of your teeth and gums. ? Regular physical activity. ? Eating a healthy diet. ? Avoiding tobacco and drug use. ? Limiting alcohol use. ? Practicing safe sex. ? Taking low-dose aspirin every day starting at age 48. What happens during an annual well check? The services and screenings done by your health care provider during your annual well check will depend on your age, overall health, lifestyle risk factors, and family history of disease. Counseling Your health care provider may ask you questions about your:  Alcohol use.  Tobacco use.  Drug use.  Emotional well-being.  Home and relationship well-being.  Sexual activity.  Eating habits.  Work and work Statistician.  Screening You may have the following tests or measurements:  Height, weight, and BMI.  Blood pressure.  Lipid and cholesterol levels. These may be checked every 5 years, or more frequently if you are over 48 years old.  Skin check.  Lung cancer screening. You may have this screening every year starting at age 48 if you have a 30-pack-year history of  smoking and currently smoke or have quit within the past 15 years.  Fecal occult blood test (FOBT) of the stool. You may have this test every year starting at age 48.  Flexible sigmoidoscopy or colonoscopy. You may have a sigmoidoscopy every 5 years or a colonoscopy every 10 years starting at age 48.  Prostate cancer screening. Recommendations will vary depending on your family history and other risks.  Hepatitis C blood test.  Hepatitis B blood test.  Sexually transmitted disease (STD) testing.  Diabetes screening. This is done by checking your blood sugar (glucose) after you have not eaten for a while (fasting). You may have this done every 1-3 years.  Discuss your test results, treatment options, and if necessary, the need for more tests with your health care provider. Vaccines Your health care provider may recommend certain vaccines, such as:  Influenza vaccine. This is recommended every year.  Tetanus, diphtheria, and acellular pertussis (Tdap, Td) vaccine. You may need a Td booster every 10 years.  Varicella vaccine. You may need this if you have not been vaccinated.  Zoster vaccine. You may need this after age 48.  Measles, mumps, and rubella (MMR) vaccine. You may need at least one dose of MMR if you were born in 1957 or later. You may also need a second dose.  Pneumococcal 13-valent conjugate (PCV13) vaccine. You may need this if you have certain conditions and have not been vaccinated.  Pneumococcal polysaccharide (PPSV23) vaccine. You may need one or two doses if you smoke cigarettes or if you have certain conditions.  Meningococcal vaccine. You may need this if you have certain conditions.  Hepatitis A vaccine. You may need this if you have certain conditions or if you travel or work in places where you may be exposed to hepatitis A.  Hepatitis B vaccine. You may need this if you have certain conditions or if you travel or work in places where you may be exposed to  hepatitis B.  Haemophilus influenzae type b (Hib) vaccine. You may need this if you have certain risk factors.  Talk to your health care provider about which screenings and vaccines you need and how often you need them. This information is not intended to replace advice given to you by your health care provider. Make sure you discuss any questions you have with your health care provider. Document Released: 02/06/2015 Document Revised: 09/30/2015 Document Reviewed: 11/11/2014 Elsevier Interactive Patient Education  2017 [employer].

## 2016-09-02 NOTE — Progress Notes (Signed)
Subjective:    Patient ID: Christopher Pace, male    DOB: 1970/11/07, 46 y.o.   MRN: [id number]  HPI  Pt in for cpe.  Pt is fasting.  Needs tdap and agrees to get.  Pt agrees to hiv screen.  Pt in past had been on vyvasnse. He reminds me of this. He had ADHD and was on vyvanse. But he states this caused him insomnia.   Works a lot but sedentary. He has not been exercising. He states if he exercised his overall energy would be better. He states diet moderate healthy. He drinks 3-4 cocktales every other day. Stopped drinking beers since I last saw him. Still smoking pack or 2 a week.  Hx of high cholesterol  Review of Systems  Constitutional: Positive for fatigue. Negative for chills and fever.  HENT: Negative for congestion, ear discharge, ear pain, hearing loss and mouth sores.   Respiratory: Negative for cough and chest tightness.   Cardiovascular: Negative for chest pain and palpitations.  Gastrointestinal: Negative for abdominal pain, constipation, nausea and vomiting.  Genitourinary: Negative for difficulty urinating, flank pain, scrotal swelling, testicular pain and urgency.       No urinary complaints. No family history of prostate cancer.  Musculoskeletal: Negative for back pain, myalgias and neck pain.  Skin: Negative for rash.  Neurological: Positive for dizziness. Negative for syncope, facial asymmetry, speech difficulty, weakness, numbness and headaches.       Pt states 5 days out of week he feel mild dizzy. Sitting and watching tv does well. Working does well.  When he gets up from seated position will feel acute dizzines. 15 seconds or so then subsides.   Also sometimes working under cars will get dizziness. He can't say for sure if turning head causes.   In past he had syncopal event but he never made the cardiologist or neurologist appointment.  Hematological: Negative for adenopathy. Does not bruise/bleed easily.  Psychiatric/Behavioral: Negative for  confusion, dysphoric mood and suicidal ideas. The patient is nervous/anxious.        Stress from work.    Past Medical History:  Diagnosis Date  . Alcohol abuse 08/08/2014  . Arthritis   . Depression   . GERD (gastroesophageal reflux disease)      Social History   Social History  . Marital status: Divorced    Spouse name: N/A  . Number of children: N/A  . Years of education: N/A   Occupational History  . Not on file.   Social History Main Topics  . Smoking status: Current Every Day Smoker  . Smokeless tobacco: Never Used  . Alcohol use 0.0 oz/week     Comment: Pt states 5 days a week some drinking. More than a six pack.  . Drug use: Unknown  . Sexual activity: Yes   Other Topics Concern  . Not on file   Social History Narrative  . No narrative on file    Past Surgical History:  Procedure Laterality Date  . KNEE SURGERY     Hardware Placed due yo break    History reviewed. No pertinent family history.  No Known Allergies  Current Outpatient Prescriptions on File Prior to Visit  Medication Sig Dispense Refill  . atorvastatin (LIPITOR) 40 MG tablet Take 1 tablet (40 mg total) by mouth daily. 90 tablet 2  . CIALIS 20 MG tablet TAKE 1 TABLET(20 MG) BY MOUTH DAILY AS NEEDED FOR ERECTILE DYSFUNCTION 10 tablet 0  . omeprazole (PRILOSEC) 40 MG  capsule TAKE 1 CAPSULE(40 MG) BY MOUTH DAILY 30 capsule 0   No current facility-administered medications on file prior to visit.     BP 130/80 Comment: checked twice and same.  Pulse 78   Temp 98.2 F (36.8 C) (Oral)   Resp 16   Ht 6' (1.829 m)   Wt 224 lb (101.6 kg)   SpO2 100%   BMI 30.38 kg/m       Objective:   Physical Exam  General Mental Status- Alert. General Appearance- Not in acute distress.   Skin General: Color- Normal Color. Moisture- Normal Moisture.  Neck Carotid Arteries- Normal color. Moisture- Normal Moisture. No carotid bruits. No JVD.  Chest and Lung Exam Auscultation: Breath  Sounds:-Normal.  Cardiovascular Auscultation:Rythm- Regular. Murmurs & Other Heart Sounds:Auscultation of the heart reveals- No Murmurs.  Abdomen Inspection:-Inspeection Normal. Palpation/Percussion:Note:No mass. Palpation and Percussion of the abdomen reveal- Non Tender, Non Distended + BS, no rebound or guarding.    Neurologic Cranial Nerve exam:- CN III-XII intact(No nystagmus), symmetric smile. Strength:- 5/5 equal and symmetric strength both upper and lower extremities. Today on sitting to standing reported no dizziness.  Genital and rectal- deferred today.    Assessment & Plan:  For you wellness exam today I have ordered cbc, cmp, tsh, lipid panel, ua and hiv.  Vaccine given tdap  Recommend exercise and healthy diet.  We will let you know lab results as they come in.  For smoking cessation wellbutrin. May benefit attention as well.  Follow up date appointment will be determined after lab review.   Recommend cutting back further on alcohol but also good job on reduction so far.  Note regarding pt dizziness. I did refill his meclizine. Advised to use for more constant dizziness type symptoms. If this worsens consider PT referral for head manueuvers.( If he reports worse with head movements). CT negative in past. If dizziness becomes more prominent would refer to neurologist. Note in past referred him for one syncope event but he never went by chart review.  Evolet Salminen, Ramon Dredge, PA-C

## 2016-09-02 NOTE — Telephone Encounter (Signed)
Refilled pt lipitor.

## 2016-09-03 LAB — HIV ANTIBODY (ROUTINE TESTING W REFLEX): HIV 1&2 Ab, 4th Generation: NONREACTIVE

## 2016-10-09 ENCOUNTER — Other Ambulatory Visit: Payer: Self-pay | Admitting: Medical

## 2016-11-06 ENCOUNTER — Other Ambulatory Visit: Payer: Self-pay | Admitting: Medical

## 2016-12-06 ENCOUNTER — Ambulatory Visit (INDEPENDENT_AMBULATORY_CARE_PROVIDER_SITE_OTHER): Payer: BLUE CROSS/BLUE SHIELD | Admitting: Medical

## 2016-12-06 ENCOUNTER — Encounter: Payer: Self-pay | Admitting: Medical

## 2016-12-06 VITALS — BP 147/90 | HR 77 | Temp 98.3°F | Resp 16 | Ht 74.0 in | Wt 229.2 lb

## 2016-12-06 DIAGNOSIS — Z87891 Personal history of nicotine dependence: Secondary | ICD-10-CM | POA: Diagnosis not present

## 2016-12-06 DIAGNOSIS — E785 Hyperlipidemia, unspecified: Secondary | ICD-10-CM | POA: Diagnosis not present

## 2016-12-06 DIAGNOSIS — F17200 Nicotine dependence, unspecified, uncomplicated: Secondary | ICD-10-CM

## 2016-12-06 DIAGNOSIS — R030 Elevated blood-pressure reading, without diagnosis of hypertension: Secondary | ICD-10-CM | POA: Diagnosis not present

## 2016-12-06 LAB — COMPREHENSIVE METABOLIC PANEL
ALBUMIN: 4.5 g/dL (ref 3.5–5.2)
ALK PHOS: 58 U/L (ref 39–117)
ALT: 47 U/L (ref 0–53)
AST: 26 U/L (ref 0–37)
BILIRUBIN TOTAL: 0.4 mg/dL (ref 0.2–1.2)
BUN: 15 mg/dL (ref 6–23)
CALCIUM: 9.8 mg/dL (ref 8.4–10.5)
CO2: 28 mEq/L (ref 19–32)
CREATININE: 1.17 mg/dL (ref 0.40–1.50)
Chloride: 102 mEq/L (ref 96–112)
GFR: 70.7 mL/min (ref >60.00)
Glucose, Bld: 90 mg/dL (ref 70–99)
Potassium: 4.8 mEq/L (ref 3.5–5.1)
Sodium: 137 mEq/L (ref 135–145)
TOTAL PROTEIN: 7.5 g/dL (ref 6.0–8.3)

## 2016-12-06 LAB — LIPID PANEL
CHOLESTEROL: 145 mg/dL (ref 0–200)
HDL: 64.9 mg/dL (ref >39.00)
NONHDL: 79.71
TRIGLYCERIDES: 312 mg/dL — AB (ref 0.0–149.0)
Total CHOL/HDL Ratio: 2
VLDL: 62.4 mg/dL — ABNORMAL HIGH (ref 0.0–40.0)

## 2016-12-06 LAB — LDL CHOLESTEROL, DIRECT: Direct LDL: 51 mg/dL

## 2016-12-06 MED ORDER — AMLODIPINE BESYLATE 10 MG PO TABS: 10.0000 mg | ORAL_TABLET | Freq: Every day | ORAL | 0 refills | Status: DC

## 2016-12-06 MED ORDER — BUPROPION HCL ER (SR) 150 MG PO TB12: ORAL_TABLET | ORAL | 3 refills | Status: DC

## 2016-12-06 NOTE — Patient Instructions (Addendum)
For high cholesterol will get lipid panel and cmp today. May need to adjust med for lipid after review.  Check you bp daily over next 7-10 days. Low salt diet and start to exercise. Weight loss 5-10 lb  may decrease bp. If bp on average exceeding 140/90 then start amlodipine. Print rx given.Please  My chart me bp readings in 7-10 days.  Continue wellbutrin for weight loss.Refill today.  Follow up date to be determined after lab review.   DASH Eating Plan DASH stands for "Dietary Approaches to Stop Hypertension." The DASH eating plan is a healthy eating plan that has been shown to reduce high blood pressure (hypertension). It may also reduce your risk for type 2 diabetes, heart disease, and stroke. The DASH eating plan may also help with weight loss. What are tips for following this plan? General guidelines  Avoid eating more than 2,300 mg (milligrams) of salt (sodium) a day. If you have hypertension, you may need to reduce your sodium intake to 1,500 mg a day.  Limit alcohol intake to no more than 1 drink a day for nonpregnant women and 2 drinks a day for men. One drink equals 12 oz of beer, 5 oz of wine, or 1 oz of hard liquor.  Work with your health care provider to maintain a healthy body weight or to lose weight. Ask what an ideal weight is for you.  Get at least 30 minutes of exercise that causes your heart to beat faster (aerobic exercise) most days of the week. Activities may include walking, swimming, or biking.  Work with your health care provider or diet and nutrition specialist (dietitian) to adjust your eating plan to your individual calorie needs. Reading food labels  Check food labels for the amount of sodium per serving. Choose foods with less than 5 percent of the Daily Value of sodium. Generally, foods with less than 300 mg of sodium per serving fit into this eating plan.  To find whole grains, look for the word "whole" as the first word in the ingredient  list. Shopping  Buy products labeled as "low-sodium" or "no salt added."  Buy fresh foods. Avoid canned foods and premade or frozen meals. Cooking  Avoid adding salt when cooking. Use salt-free seasonings or herbs instead of table salt or sea salt. Check with your health care provider or pharmacist before using salt substitutes.  Do not fry foods. Cook foods using healthy methods such as baking, boiling, grilling, and broiling instead.  Cook with heart-healthy oils, such as olive, canola, soybean, or sunflower oil. Meal planning   Eat a balanced diet that includes: ? 5 or more servings of fruits and vegetables each day. At each meal, try to fill half of your plate with fruits and vegetables. ? Up to 6-8 servings of whole grains each day. ? Less than 6 oz of lean meat, poultry, or fish each day. A 3-oz serving of meat is about the same size as a deck of cards. One egg equals 1 oz. ? 2 servings of low-fat dairy each day. ? A serving of nuts, seeds, or beans 5 times each week. ? Heart-healthy fats. Healthy fats called Omega-3 fatty acids are found in foods such as flaxseeds and coldwater fish, like sardines, salmon, and mackerel.  Limit how much you eat of the following: ? Canned or prepackaged foods. ? Food that is high in trans fat, such as fried foods. ? Food that is high in saturated fat, such as fatty meat. ?  Sweets, desserts, sugary drinks, and other foods with added sugar. ? Full-fat dairy products.  Do not salt foods before eating.  Try to eat at least 2 vegetarian meals each week.  Eat more home-cooked food and less restaurant, buffet, and fast food.  When eating at a restaurant, ask that your food be prepared with less salt or no salt, if possible. What foods are recommended? The items listed may not be a complete list. Talk with your dietitian about what dietary choices are best for you. Grains Whole-grain or whole-wheat bread. Whole-grain or whole-wheat pasta. Brown  rice. [place]. Bulgur. Whole-grain and low-sodium cereals. Pita bread. Low-fat, low-sodium crackers. Whole-wheat flour tortillas. Vegetables Fresh or frozen vegetables (raw, steamed, roasted, or grilled). Low-sodium or reduced-sodium tomato and vegetable juice. Low-sodium or reduced-sodium tomato sauce and tomato paste. Low-sodium or reduced-sodium canned vegetables. Fruits All fresh, dried, or frozen fruit. Canned fruit in natural juice (without added sugar). Meat and other protein foods Skinless chicken or [place]. Ground chicken or [place]. Pork with fat trimmed off. Fish and seafood. Egg whites. Dried beans, peas, or lentils. Unsalted nuts, nut butters, and seeds. Unsalted canned beans. Lean cuts of beef with fat trimmed off. Low-sodium, lean deli meat. Dairy Low-fat (1%) or fat-free (skim) milk. Fat-free, low-fat, or reduced-fat cheeses. Nonfat, low-sodium ricotta or cottage cheese. Low-fat or nonfat yogurt. Low-fat, low-sodium cheese. Fats and oils Soft margarine without trans fats. Vegetable oil. Low-fat, reduced-fat, or light mayonnaise and salad dressings (reduced-sodium). Canola, safflower, olive, soybean, and sunflower oils. Avocado. Seasoning and other foods Herbs. Spices. Seasoning mixes without salt. Unsalted popcorn and pretzels. Fat-free sweets. What foods are not recommended? The items listed may not be a complete list. Talk with your dietitian about what dietary choices are best for you. Grains Baked goods made with fat, such as croissants, muffins, or some breads. Dry pasta or rice meal packs. Vegetables Creamed or fried vegetables. Vegetables in a cheese sauce. Regular canned vegetables (not low-sodium or reduced-sodium). Regular canned tomato sauce and paste (not low-sodium or reduced-sodium). Regular tomato and vegetable juice (not low-sodium or reduced-sodium). Angie Fava. Olives. Fruits Canned fruit in a light or heavy syrup. Fried fruit. Fruit in cream or butter  sauce. Meat and other protein foods Fatty cuts of meat. Ribs. Fried meat. Berniece Salines. Sausage. Bologna and other processed lunch meats. Salami. Fatback. Hotdogs. Bratwurst. Salted nuts and seeds. Canned beans with added salt. Canned or smoked fish. Whole eggs or egg yolks. Chicken or [place] with skin. Dairy Whole or 2% milk, cream, and half-and-half. Whole or full-fat cream cheese. Whole-fat or sweetened yogurt. Full-fat cheese. Nondairy creamers. Whipped toppings. Processed cheese and cheese spreads. Fats and oils Butter. Stick margarine. Lard. Shortening. Ghee. Bacon fat. Tropical oils, such as coconut, palm kernel, or palm oil. Seasoning and other foods Salted popcorn and pretzels. Onion salt, garlic salt, seasoned salt, table salt, and sea salt. Worcestershire sauce. Tartar sauce. Barbecue sauce. Teriyaki sauce. Soy sauce, including reduced-sodium. Steak sauce. Canned and packaged gravies. Fish sauce. Oyster sauce. Cocktail sauce. Horseradish that you find on the shelf. Ketchup. Mustard. Meat flavorings and tenderizers. Bouillon cubes. Hot sauce and Tabasco sauce. Premade or packaged marinades. Premade or packaged taco seasonings. Relishes. Regular salad dressings. Where to find more information:  National Heart, Lung, and [place]: https://wilson-eaton.com/  American Heart Association: www.heart.org Summary  The DASH eating plan is a healthy eating plan that has been shown to reduce high blood pressure (hypertension). It may also reduce your risk for type 2 diabetes,  heart disease, and stroke.  With the DASH eating plan, you should limit salt (sodium) intake to 2,300 mg a day. If you have hypertension, you may need to reduce your sodium intake to 1,500 mg a day.  When on the DASH eating plan, aim to eat more fresh fruits and vegetables, whole grains, lean proteins, low-fat dairy, and heart-healthy fats.  Work with your health care provider or diet and nutrition specialist (dietitian) to adjust  your eating plan to your individual calorie needs. This information is not intended to replace advice given to you by your health care provider. Make sure you discuss any questions you have with your health care provider. Document Released: 12/30/2010 Document Revised: 01/04/2016 Document Reviewed: 01/04/2016 Elsevier Interactive Patient Education  2017 [employer].

## 2016-12-06 NOTE — Progress Notes (Signed)
Subjective:    Patient ID: Christopher Pace, male    DOB: Mar 24, 1970, 46 y.o. y.o.   MRN: [id number]  HPI  Pt in for follow up.  Pt states he has not been exercising. But he does plan to start swimming 3 times a week. Former [employer]. Pt has been on lipitor for cholesterol. No side effects. He is fasting.  Pt bp mild high last time at wellness. He rarely checks his blood pressure.  No cardiac or neurologic signs or symptoms.  Pt has decreased smoking with wellbutrin. He went from pack a day to pack or two at most a week.  Recent stress related to work.     Review of Systems  Constitutional: Negative for chills, fatigue and fever.  Respiratory: Negative for cough, chest tightness, shortness of breath and wheezing.   Cardiovascular: Negative for chest pain and palpitations.  Gastrointestinal: Negative for abdominal pain, blood in stool, diarrhea and vomiting.  Musculoskeletal: Negative for back pain.  Skin: Negative for rash.  Neurological: Negative for dizziness, facial asymmetry, speech difficulty, weakness, numbness and headaches.  Hematological: Negative for adenopathy. Does not bruise/bleed easily.  Psychiatric/Behavioral: Negative for confusion, dysphoric mood and suicidal ideas. The patient is not nervous/anxious.        Some stress.   Past Medical History:  Diagnosis Date  . Alcohol abuse 08/08/2014  . Arthritis   . Depression   . GERD (gastroesophageal reflux disease)      Social History   Socioeconomic History  . Marital status: Divorced    Spouse name: Not on file  . Number of children: Not on file  . Years of education: Not on file  . Highest education level: Not on file  Social Needs  . Financial resource strain: Not on file  . Food insecurity - worry: Not on file  . Food insecurity - inability: Not on file  . Transportation needs - medical: Not on file  . Transportation needs - non-medical: Not on file  Occupational History  . Not on file  Tobacco Use  .  Smoking status: Current Every Day Smoker  . Smokeless tobacco: Never Used  Substance and Sexual Activity  . Alcohol use: Yes    Alcohol/week: 0.0 oz    Comment: Pt states 5 days a week some drinking. More than a six pack.  . Drug use: Not on file  . Sexual activity: Yes  Other Topics Concern  . Not on file  Social History Narrative  . Not on file    Past Surgical History:  Procedure Laterality Date  . KNEE SURGERY     Hardware Placed due yo break    No family history on file.  No Known Allergies  Current Outpatient Medications on File Prior to Visit  Medication Sig Dispense Refill  . atorvastatin (LIPITOR) 40 MG tablet Take 1 tablet (40 mg total) by mouth daily. 90 tablet 1  . buPROPion (WELLBUTRIN SR) 150 MG 12 hr tablet 1 tab po q day for 3 days then twice daily 63 tablet 1  . CIALIS 20 MG tablet TAKE 1 TABLET(20 MG) BY MOUTH DAILY AS NEEDED FOR ERECTILE DYSFUNCTION 10 tablet 0  . fluticasone (FLONASE) 50 MCG/ACT nasal spray Place 2 sprays into both nostrils daily. 16 g 5  . meclizine (ANTIVERT) 25 MG tablet TAKE 1 TABLET(25 MG) BY MOUTH THREE TIMES DAILY AS NEEDED FOR DIZZINESS 30 tablet 0  . omeprazole (PRILOSEC) 40 MG capsule TAKE 1 CAPSULE(40 MG) BY MOUTH DAILY 30 capsule  0  . omeprazole (PRILOSEC) 40 MG capsule TAKE 1 CAPSULE(40 MG) BY MOUTH DAILY 30 capsule 6  . tadalafil (CIALIS) 20 MG tablet Take 1 tablet (20 mg total) by mouth daily as needed for erectile dysfunction. 10 tablet 3   No current facility-administered medications on file prior to visit.     BP (!) 147/90   Pulse 77   Temp 98.3 F (36.8 C) (Oral)   Resp 16   Ht 6\' 2"  (1.88 m)   Wt 229 lb 3.2 oz (104 kg)   SpO2 99%   BMI 29.43 kg/m       Objective:   Physical Exam  General Mental Status- Alert. General Appearance- Not in acute distress.   Skin General: Color- Normal Color. Moisture- Normal Moisture.  Neck Carotid Arteries- Normal color. Moisture- Normal Moisture. No carotid bruits. No  JVD.  Chest and Lung Exam Auscultation: Breath Sounds:-Normal.  Cardiovascular Auscultation:Rythm- Regular. Murmurs & Other Heart Sounds:Auscultation of the heart reveals- No Murmurs.  Abdomen Inspection:-Inspeection Normal. Palpation/Percussion:Note:No mass. Palpation and Percussion of the abdomen reveal- Non Tender, Non Distended + BS, no rebound or guarding.    Neurologic Cranial Nerve exam:- CN III-XII intact(No nystagmus), symmetric smile. Strength:- 5/5 equal and symmetric strength both upper and lower extremities.      Assessment & Plan:  For high cholesterol will get lipid panel and cmp today. May need to adjust med for lipid after review.  Check you bp daily over next 7-10 days. Low salt diet and start to exercise. Weight loss 5-10  Lb may decrease bp.  Continue wellbutrin for weight loss.Refill today.  Follow up date to be determined after lab review.  Ranya Fiddler, Ramon DredgeEdward, PA-C

## 2016-12-07 ENCOUNTER — Telehealth: Payer: Self-pay

## 2016-12-07 ENCOUNTER — Telehealth: Payer: Self-pay | Admitting: Medical

## 2016-12-07 MED ORDER — FENOFIBRATE 48 MG PO TABS
48.0000 mg | ORAL_TABLET | Freq: Every day | ORAL | 1 refills | Status: DC
Start: 2016-12-07 — End: 2018-02-07

## 2016-12-07 NOTE — Telephone Encounter (Signed)
PA initiated via Covermymeds; KEY: [id number].   No Prior Authorization is required at this time based on the alternative drug you chose to prescribe.;CaseId:[id number];Status:Cancelled;

## 2016-12-07 NOTE — Telephone Encounter (Signed)
Fenofibrate sent to pt pharmacy. 

## 2016-12-26 ENCOUNTER — Telehealth: Payer: Self-pay | Admitting: Medical

## 2016-12-26 MED ORDER — TADALAFIL 20 MG PO TABS: ORAL_TABLET | ORAL | 0 refills | Status: DC

## 2016-12-26 NOTE — Telephone Encounter (Signed)
See refill of Cialis

## 2016-12-26 NOTE — Telephone Encounter (Signed)
Copied from CRM [phone]. Topic: Quick Communication - See Telephone Encounter >> Dec 26, 2016 12:11 PM Eston Mouldavis, Yoshiaki Kreuser B wrote: CRM for notification. See Telephone encounter for:  Refill  cialis     ok with generic  -    [facility] [address] 12/26/16.

## 2016-12-26 NOTE — Telephone Encounter (Signed)
Rx sent to pharmacy   

## 2017-01-06 ENCOUNTER — Telehealth: Payer: Self-pay

## 2017-01-06 ENCOUNTER — Telehealth: Payer: Self-pay | Admitting: Medical

## 2017-01-06 MED ORDER — SILDENAFIL CITRATE 100 MG PO TABS: 100.0000 mg | ORAL_TABLET | Freq: Every day | ORAL | 3 refills | Status: DC | PRN

## 2017-01-06 NOTE — Telephone Encounter (Signed)
Pt's wife called upset that no one has done PA on Cialis for Pt- which I have back on 12/07/2016 and was informed that no PA was needed for this medication. She is requesting to speak with Ramon DredgeEdward herself.

## 2017-01-06 NOTE — Telephone Encounter (Signed)
I talked with patient today and he is okay with generic Viagra.  This should be covered.  I asked them to call and confirm this in light of the fact that his wife was upset.  Patient was okay with the prescribing generic Viagra at the higher dose.  He expressed understanding regarding the Cialis pior authorization issue.

## 2017-01-06 NOTE — Telephone Encounter (Signed)
I sent in prescription of Viagra to patient's pharmacy.  I was trying to hold online to confirm that it is covered.  They should cover it since there is a generic now.  But patient's wife was upset when they had problems feeling cialis.  So would you call the pharmacy and verify that is covered/not too expensive.  Also would you ask what some maximum number of Viagra that can be written if it is generic.

## 2017-01-20 NOTE — Addendum Note (Signed)
Addended byConrad El Negro: Ohm Dentler D on: 01/20/2017 12:49 PM   Modules accepted: Orders

## 2017-01-20 NOTE — Telephone Encounter (Addendum)
PA initiated via Covermymeds; KEY: [id number]. Received real-time PA approval.   [id number];AOZHYQ:MVHQIONGCaseId:[id number];Status:Approved;Review Type:Prior Auth;Coverage Start Date:12/21/2016;Coverage End Date:01/20/2018;

## 2017-01-20 NOTE — Telephone Encounter (Signed)
Spoke w/ Marylene LandAngela, informed of PA approval. She requested I call [facility] to inform them. Tried calling into speak with someone directly however voice recorder informed of long wait time- I ended up leaving a message on prescriber line w/ PA approval information for Pt and my name and contact number if they have questions.

## 2017-01-20 NOTE — Telephone Encounter (Signed)
Pt's spouse Christopher Land(Angela) called in, she said that Viagra need a PA also, she said that [facility] advised her that they do not see a PA in system for Cialis. Spouse says that she would like to know if office could call her and they three way the number to [facility] to complete PA? I did advise that Im not sure but would send the message.    CB: [phone]

## 2017-04-18 ENCOUNTER — Other Ambulatory Visit: Payer: Self-pay | Admitting: Medical

## 2017-04-25 ENCOUNTER — Other Ambulatory Visit: Payer: Self-pay | Admitting: Medical

## 2017-05-19 ENCOUNTER — Other Ambulatory Visit: Payer: Self-pay | Admitting: Medical

## 2017-05-27 ENCOUNTER — Other Ambulatory Visit: Payer: Self-pay | Admitting: Medical

## 2017-06-22 ENCOUNTER — Other Ambulatory Visit: Payer: Self-pay | Admitting: Medical

## 2017-06-22 DIAGNOSIS — R635 Abnormal weight gain: Secondary | ICD-10-CM

## 2017-06-22 NOTE — Telephone Encounter (Signed)
Pt due for wellbutrin refill; refill processed. Last OV with Edward on 12/06/2016. Findings routed to [facility] to assess need for scheduled follow-up visit.

## 2017-06-23 ENCOUNTER — Telehealth: Payer: Self-pay | Admitting: Medical

## 2017-06-23 DIAGNOSIS — R635 Abnormal weight gain: Secondary | ICD-10-CM

## 2017-06-23 MED ORDER — BUPROPION HCL ER (SR) 150 MG PO TB12: 150.0000 mg | ORAL_TABLET | Freq: Two times a day (BID) | ORAL | 0 refills | Status: DC

## 2017-06-23 NOTE — Telephone Encounter (Signed)
2 week refill of wellbutrin given pending appointment.

## 2017-06-23 NOTE — Telephone Encounter (Signed)
Author phoned per Esperanza RichtersEdward Saguier, PA request to schedule a follow-up BP appointment.

## 2017-06-23 NOTE — Telephone Encounter (Signed)
Author phoned per Esperanza Richters, PA request to schedule a follow-up BP appointment. Author left VM asking to call back to schedule an appointment for f/u and additional refills on wellbutrin. Awaiting call back [phone].

## 2017-07-04 ENCOUNTER — Ambulatory Visit: Payer: BLUE CROSS/BLUE SHIELD | Admitting: Medical

## 2017-07-04 ENCOUNTER — Encounter: Payer: Self-pay | Admitting: Medical

## 2017-07-04 VITALS — BP 140/80 | HR 77 | Temp 97.8°F | Resp 16 | Ht 74.0 in | Wt 230.6 lb

## 2017-07-04 DIAGNOSIS — E785 Hyperlipidemia, unspecified: Secondary | ICD-10-CM

## 2017-07-04 DIAGNOSIS — R635 Abnormal weight gain: Secondary | ICD-10-CM

## 2017-07-04 DIAGNOSIS — F17200 Nicotine dependence, unspecified, uncomplicated: Secondary | ICD-10-CM

## 2017-07-04 DIAGNOSIS — I10 Essential (primary) hypertension: Secondary | ICD-10-CM

## 2017-07-04 DIAGNOSIS — F419 Anxiety disorder, unspecified: Secondary | ICD-10-CM | POA: Diagnosis not present

## 2017-07-04 DIAGNOSIS — K219 Gastro-esophageal reflux disease without esophagitis: Secondary | ICD-10-CM | POA: Diagnosis not present

## 2017-07-04 LAB — COMPREHENSIVE METABOLIC PANEL
ALBUMIN: 4.3 g/dL (ref 3.5–5.2)
ALT: 42 U/L (ref 0–53)
AST: 24 U/L (ref 0–37)
Alkaline Phosphatase: 60 U/L (ref 39–117)
BUN: 15 mg/dL (ref 6–23)
CALCIUM: 9.7 mg/dL (ref 8.4–10.5)
CHLORIDE: 105 meq/L (ref 96–112)
CO2: 29 mEq/L (ref 19–32)
Creatinine, Ser: 1.18 mg/dL (ref 0.40–1.50)
GFR: 69.84 mL/min (ref >60.00)
Glucose, Bld: 95 mg/dL (ref 70–99)
POTASSIUM: 5.2 meq/L — AB (ref 3.5–5.1)
SODIUM: 141 meq/L (ref 135–145)
TOTAL PROTEIN: 6.7 g/dL (ref 6.0–8.3)
Total Bilirubin: 0.3 mg/dL (ref 0.2–1.2)

## 2017-07-04 LAB — LIPID PANEL
CHOLESTEROL: 171 mg/dL (ref 0–200)
HDL: 51.5 mg/dL (ref >39.00)
NonHDL: 119.91
TRIGLYCERIDES: 357 mg/dL — AB (ref 0.0–149.0)
Total CHOL/HDL Ratio: 3
VLDL: 71.4 mg/dL — AB (ref 0.0–40.0)

## 2017-07-04 LAB — LDL CHOLESTEROL, DIRECT: Direct LDL: 81 mg/dL

## 2017-07-04 MED ORDER — SILDENAFIL CITRATE 20 MG PO TABS: ORAL_TABLET | ORAL | 3 refills | Status: DC

## 2017-07-04 MED ORDER — BUPROPION HCL ER (SR) 150 MG PO TB12: 150.0000 mg | ORAL_TABLET | Freq: Two times a day (BID) | ORAL | 2 refills | Status: DC

## 2017-07-04 MED ORDER — OMEPRAZOLE 40 MG PO CPDR: DELAYED_RELEASE_CAPSULE | ORAL | 11 refills | Status: DC

## 2017-07-04 MED ORDER — BUSPIRONE HCL 7.5 MG PO TABS: 7.5000 mg | ORAL_TABLET | Freq: Three times a day (TID) | ORAL | 0 refills | Status: DC

## 2017-07-04 NOTE — Progress Notes (Signed)
Subjective:    Patient ID: Christopher CriglerMichael S Pace, male    DOB: January 08, 1971, 47 y.o.   MRN: [id number]  HPI  Pt in for follow up.  Pt here for bp check. His bp up initially here. Diastolic better after bp recheck.  Pt is not working out. He may get 10,000 steps.  Pt did not eat breakfast today. He is on lipitor.   He is trying to quit smoking. He is smoking about 1-2 packs a week.   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 Sometimes driving will cause anxiety. Some daily anxiety and in past he used to smoke and drink to control anxiety.  He has cut back on drinking. Now only drinking one day a week. Before moderate- heavy drinking every other day.    Review of Systems  Constitutional: Negative for chills, fatigue, fever and unexpected weight change.  HENT: Negative for congestion, ear discharge, ear pain and facial swelling.   Respiratory: Negative for cough, chest tightness, shortness of breath and wheezing.   Cardiovascular: Negative for chest pain and palpitations.  Gastrointestinal: Negative for abdominal pain and anal bleeding.       Easily gets reflux if not on ppi.  Genitourinary: Negative for difficulty urinating, enuresis, flank pain, frequency, genital sores, scrotal swelling, testicular pain and urgency.       ED.  Musculoskeletal: Negative for back pain and gait problem.  Skin: Negative for rash.  Neurological: Negative for facial asymmetry and headaches.  Hematological: Negative for adenopathy. Does not bruise/bleed easily.  Psychiatric/Behavioral: Negative for behavioral problems, confusion, dysphoric mood and sleep disturbance. The patient is nervous/anxious.    Past Medical History:  Diagnosis Date  . Alcohol abuse 08/08/2014  . Arthritis   . Depression   . GERD (gastroesophageal reflux disease)      Social History   Socioeconomic History  . Marital status: Divorced    Spouse name: Not on file  . Number of children: Not on file   . Years of education: Not on file  . Highest education level: Not on file  Occupational History  . Not on file  Social Needs  . Financial resource strain: Not on file  . Food insecurity:    Worry: Not on file    Inability: Not on file  . Transportation needs:    Medical: Not on file    Non-medical: Not on file  Tobacco Use  . Smoking status: Current Every Day Smoker  . Smokeless tobacco: Never Used  Substance and Sexual Activity  . Alcohol use: Yes    Alcohol/week: 0.0 oz    Comment: Pt states 5 days a week some drinking. More than a six pack.  . Drug use: Not on file  . Sexual activity: Yes  Lifestyle  . Physical activity:    Days per week: Not on file    Minutes per session: Not on file  . Stress: Not on file  Relationships  . Social connections:    Talks on phone: Not on file    Gets together: Not on file    Attends religious service: Not on file    Active member of club or organization: Not on file    Attends meetings of clubs or organizations: Not on file    Relationship status: Not on file  . Intimate partner violence:    Fear of current or ex partner: Not on file    Emotionally abused: Not on file  Physically abused: Not on file    Forced sexual activity: Not on file  Other Topics Concern  . Not on file  Social History Narrative  . Not on file    Past Surgical History:  Procedure Laterality Date  . KNEE SURGERY     Hardware Placed due yo break    No family history on file.  No Known Allergies  Current Outpatient Medications on File Prior to Visit  Medication Sig Dispense Refill  . amLODipine (NORVASC) 10 MG tablet Take 1 tablet (10 mg total) daily by mouth. 30 tablet 0  . atorvastatin (LIPITOR) 40 MG tablet Take 1 tablet (40 mg total) by mouth daily. 90 tablet 1  . buPROPion (WELLBUTRIN SR) 150 MG 12 hr tablet Take 1 tablet (150 mg total) by mouth 2 (two) times daily. 30 tablet 0  . fenofibrate (TRICOR) 48 MG tablet Take 1 tablet (48 mg total)  daily by mouth. 90 tablet 1  . fluticasone (FLONASE) 50 MCG/ACT nasal spray Place 2 sprays into both nostrils daily. 16 g 5  . meclizine (ANTIVERT) 25 MG tablet TAKE 1 TABLET(25 MG) BY MOUTH THREE TIMES DAILY AS NEEDED FOR DIZZINESS 30 tablet 0  . sildenafil (VIAGRA) 100 MG tablet Take 1 tablet (100 mg total) by mouth daily as needed for erectile dysfunction. 10 tablet 3   No current facility-administered medications on file prior to visit.     BP 140/80   Pulse 77   Temp 97.8 F (36.6 C) (Oral)   Resp 16   Ht 6\' 2"  (1.88 m)   Wt 230 lb 9.6 oz (104.6 kg)   SpO2 99%   BMI 29.61 kg/m       Objective:   Physical Exam  General Mental Status- Alert. General Appearance- Not in acute distress.   Skin General: Color- Normal Color. Moisture- Normal Moisture.  Neck Carotid Arteries- Normal color. Moisture- Normal Moisture. No carotid bruits. No JVD.  Chest and Lung Exam Auscultation: Breath Sounds:-Normal.  Cardiovascular Auscultation:Rythm- Regular. Murmurs & Other Heart Sounds:Auscultation of the heart reveals- No Murmurs.  Abdomen Inspection:-Inspeection Normal. Palpation/Percussion:Note:No mass. Palpation and Percussion of the abdomen reveal- Non Tender, Non Distended + BS, no rebound or guarding.    Neurologic Cranial Nerve exam:- CN III-XII intact(No nystagmus), symmetric smile. Strength:- 5/5 equal and symmetric strength both upper and lower extremities.      Assessment & Plan:  For htn please continue amlodipine 10 mg a day.  For high cholesterol/tryglercides will get cmp and lipid panel today.   For reflux continue ompeprazole.  Continue to try to quit smoking.(refilled wellbutrin)  For anxiety rx buspar.   Try to diet and get more exercise.   For ED rx viagra.  Follow up date to be determined after lab review.  Esperanza Richters, PA-C

## 2017-07-04 NOTE — Patient Instructions (Addendum)
For htn please continue amlodipine 10 mg a day.  For high cholesterol/tryglercides will get cmp and lipid panel today.   For reflux continue ompeprazole.  Continue to try to quit smoking.(refilled wellbutrin)  For anxiety rx buspar.   Try to diet and get more exercise.(help you loose weight)  For ED rx viagra.  Follow up date to be determined after lab review.

## 2017-07-11 ENCOUNTER — Telehealth: Payer: Self-pay | Admitting: Medical

## 2017-07-11 NOTE — Telephone Encounter (Signed)
Copied from CRM [phone]. Topic: Quick Communication - See Telephone Encounter >> Jul 11, 2017 10:37 AM Louie BunPalacios Medina, Rosey Batheresa D wrote: CRM for notification. See Telephone encounter for: 07/11/17. Patient called back and wanted to let Ramon Dredgedward know that he is still on it his cholesterol medication. He would like to talk to Saguier CMA about his medications.

## 2017-07-13 ENCOUNTER — Telehealth: Payer: Self-pay | Admitting: Medical

## 2017-07-13 NOTE — Telephone Encounter (Signed)
I sent prescription of atorvastatin and advised take fenofibrate. Appears he had not been taking atrovastatin regularly and should have ran out in [place]. Refilled rx.  Diet and exercise.  Follow up 2 months repeat lipid panel fasting. Called pt and discussed recent lipid panel.

## 2017-07-28 ENCOUNTER — Other Ambulatory Visit: Payer: Self-pay | Admitting: Medical

## 2017-08-16 ENCOUNTER — Other Ambulatory Visit: Payer: Self-pay | Admitting: Medical

## 2017-09-01 ENCOUNTER — Other Ambulatory Visit: Payer: Self-pay | Admitting: Medical

## 2017-10-02 ENCOUNTER — Other Ambulatory Visit: Payer: Self-pay | Admitting: Medical

## 2017-10-27 ENCOUNTER — Other Ambulatory Visit: Payer: Self-pay | Admitting: Medical

## 2017-10-27 DIAGNOSIS — R635 Abnormal weight gain: Secondary | ICD-10-CM

## 2017-10-31 ENCOUNTER — Other Ambulatory Visit: Payer: Self-pay | Admitting: Medical

## 2017-11-30 ENCOUNTER — Other Ambulatory Visit: Payer: Self-pay | Admitting: Medical

## 2017-12-06 ENCOUNTER — Other Ambulatory Visit: Payer: Self-pay | Admitting: Medical

## 2017-12-06 NOTE — Telephone Encounter (Signed)
Please see refill protocol failure and advise? 

## 2017-12-06 NOTE — Telephone Encounter (Signed)
Not sure what is protocol failure. Is this automatic refill request from pharmacy or did pt generate this. I remember he had problems getting prior auth and complained of cost. Last rx is sildanefil 20 mg tabs.(revatio). Could you call pt and see if he had requested this. Does he want me to rx 100- mg tabs or 20 mg tabs.

## 2017-12-18 NOTE — Telephone Encounter (Signed)
Christopher Pace -- please see below refill protocol failure. Rx came from pharmacy.  Last BP in normal range       BP Readings from Last 1 Encounters:  07/04/17 140/80

## 2017-12-19 NOTE — Telephone Encounter (Signed)
Rx refill of viagra. Ask pt to come in for regular check up please. bp etc.

## 2017-12-21 ENCOUNTER — Other Ambulatory Visit: Payer: Self-pay | Admitting: Medical

## 2017-12-25 NOTE — Telephone Encounter (Signed)
Left detailed message on pt's voicemail to call and schedule OV with Ramon DredgeEdward soon. [facility] to discuss with pt.

## 2018-01-08 ENCOUNTER — Telehealth: Payer: Self-pay

## 2018-01-08 NOTE — Telephone Encounter (Signed)
PA approved.  CaseId:[id number];Status:Approved;Review Type:Prior Auth;Coverage Start Date:12/09/2017;Coverage End Date:01/08/2019

## 2018-01-08 NOTE — Telephone Encounter (Signed)
PA initiated via Covermymeds; KEY: [id number]. Awaiting determination.

## 2018-01-20 ENCOUNTER — Other Ambulatory Visit: Payer: Self-pay | Admitting: Medical

## 2018-02-07 ENCOUNTER — Ambulatory Visit: Payer: BLUE CROSS/BLUE SHIELD | Admitting: Medical

## 2018-02-07 ENCOUNTER — Encounter: Payer: Self-pay | Admitting: Medical

## 2018-02-07 VITALS — BP 120/70 | HR 70 | Temp 98.1°F | Resp 16 | Ht 74.0 in | Wt 220.0 lb

## 2018-02-07 DIAGNOSIS — K219 Gastro-esophageal reflux disease without esophagitis: Secondary | ICD-10-CM | POA: Diagnosis not present

## 2018-02-07 DIAGNOSIS — F419 Anxiety disorder, unspecified: Secondary | ICD-10-CM

## 2018-02-07 DIAGNOSIS — F17200 Nicotine dependence, unspecified, uncomplicated: Secondary | ICD-10-CM | POA: Diagnosis not present

## 2018-02-07 DIAGNOSIS — I10 Essential (primary) hypertension: Secondary | ICD-10-CM | POA: Diagnosis not present

## 2018-02-07 DIAGNOSIS — E785 Hyperlipidemia, unspecified: Secondary | ICD-10-CM | POA: Diagnosis not present

## 2018-02-07 DIAGNOSIS — N529 Male erectile dysfunction, unspecified: Secondary | ICD-10-CM

## 2018-02-07 MED ORDER — SILDENAFIL CITRATE 20 MG PO TABS: ORAL_TABLET | ORAL | 3 refills | Status: DC

## 2018-02-07 MED ORDER — ATORVASTATIN CALCIUM 40 MG PO TABS: ORAL_TABLET | ORAL | 1 refills | Status: DC

## 2018-02-07 MED ORDER — BUSPIRONE HCL 7.5 MG PO TABS: 7.5000 mg | ORAL_TABLET | Freq: Three times a day (TID) | ORAL | 0 refills | Status: DC

## 2018-02-07 MED ORDER — FENOFIBRATE 48 MG PO TABS: 48.0000 mg | ORAL_TABLET | Freq: Every day | ORAL | 1 refills | Status: DC

## 2018-02-07 NOTE — Progress Notes (Signed)
Subjective:    Patient ID: Christopher Pace, male    DOB: [birth year], 48 y.o.   MRN: [id number]  HPI  Pt in for follow up.  Pt has been swimming 3 days a week. Also 10,000 steps a day.  Pt bp is 129/83. Pt thinks has not been on bp meds.  He still has severe anxiety. Even leaving house and going out to eat makes him anxious. Pt is still on celexa. Pt had rx of buspar. I had written that in the summer. He thinks he never got that filled.  Pt states he lost about 20 lbs with diet and exercise.  He is on mostly low carb diet and eating more meat.   Pt is still smoking. I rx'd wellbutrin but he admits he has not been taking.  Pt still has some reflux. On prilosec.  He has ED and uses viagra.(20 mg dose).   The 10-year ASCVD risk score Denman George DC Montez Hageman., et al., 2013) is: 6.3%   Values used to calculate the score:     Age: 57 years     Sex: Male     Is Non-Hispanic African American: No     Diabetic: No     Tobacco smoker: Yes     Systolic Blood Pressure: 126 mmHg     Is BP treated: Yes     HDL Cholesterol: 51.5 mg/dL     Total Cholesterol: 171 mg/dL      Review of Systems  Constitutional: Negative for chills, fatigue and fever.  Respiratory: Negative for cough, shortness of breath and wheezing.   Cardiovascular: Negative for chest pain and palpitations.  Gastrointestinal: Negative for abdominal pain and constipation.  Genitourinary:       ED  Musculoskeletal: Negative for back pain.  Skin: Negative for rash.  Neurological: Negative for seizures, facial asymmetry, speech difficulty, weakness and light-headedness.  Hematological: Negative for adenopathy. Does not bruise/bleed easily.  Psychiatric/Behavioral: Negative for behavioral problems, confusion, dysphoric mood and sleep disturbance. The patient is nervous/anxious.     Past Medical History:  Diagnosis Date  . Alcohol abuse 08/08/2014  . Arthritis   . Depression   . Generalized anxiety disorder   . GERD  (gastroesophageal reflux disease)      Social History   Socioeconomic History  . Marital status: Divorced    Spouse name: Not on file  . Number of children: Not on file  . Years of education: Not on file  . Highest education level: Not on file  Occupational History  . Not on file  Social Needs  . Financial resource strain: Not on file  . Food insecurity:    Worry: Not on file    Inability: Not on file  . Transportation needs:    Medical: Not on file    Non-medical: Not on file  Tobacco Use  . Smoking status: Current Every Day Smoker  . Smokeless tobacco: Never Used  Substance and Sexual Activity  . Alcohol use: Yes    Alcohol/week: 0.0 standard drinks    Comment: Pt states 5 days a week some drinking. More than a six pack.  . Drug use: Not on file  . Sexual activity: Yes  Lifestyle  . Physical activity:    Days per week: Not on file    Minutes per session: Not on file  . Stress: Not on file  Relationships  . Social connections:    Talks on phone: Not on file    Gets together: Not  on file    Attends religious service: Not on file    Active member of club or organization: Not on file    Attends meetings of clubs or organizations: Not on file    Relationship status: Not on file  . Intimate partner violence:    Fear of current or ex partner: Not on file    Emotionally abused: Not on file    Physically abused: Not on file    Forced sexual activity: Not on file  Other Topics Concern  . Not on file  Social History Narrative  . Not on file    Past Surgical History:  Procedure Laterality Date  . KNEE SURGERY     Hardware Placed due yo break    No family history on file.  No Known Allergies  Current Outpatient Medications on File Prior to Visit  Medication Sig Dispense Refill  . amLODipine (NORVASC) 10 MG tablet Take 1 tablet (10 mg total) daily by mouth. 30 tablet 0  . atorvastatin (LIPITOR) 40 MG tablet TAKE 1 TABLET(40 MG) BY MOUTH DAILY 90 tablet 1  .  buPROPion (WELLBUTRIN SR) 150 MG 12 hr tablet Take 1 tablet (150 mg total) by mouth 2 (two) times daily. 60 tablet 2  . buPROPion (WELLBUTRIN SR) 150 MG 12 hr tablet TAKE 1 TABLET(150 MG) BY MOUTH TWICE DAILY 60 tablet 0  . busPIRone (BUSPAR) 7.5 MG tablet Take 1 tablet (7.5 mg total) by mouth 3 (three) times daily. 45 tablet 0  . fenofibrate (TRICOR) 48 MG tablet Take 1 tablet (48 mg total) daily by mouth. 90 tablet 1  . fluticasone (FLONASE) 50 MCG/ACT nasal spray SHAKE LIQUID AND USE 2 SPRAYS IN EACH NOSTRIL DAILY 16 g 0  . meclizine (ANTIVERT) 25 MG tablet TAKE 1 TABLET(25 MG) BY MOUTH THREE TIMES DAILY AS NEEDED FOR DIZZINESS 30 tablet 0  . omeprazole (PRILOSEC) 40 MG capsule 1 tab po q day 30 capsule 11  . sildenafil (REVATIO) 20 MG tablet 2-5 tab po prior as needed prior to sex 50 tablet 3  . sildenafil (VIAGRA) 100 MG tablet TAKE 1 TABLET(100 MG) BY MOUTH DAILY AS NEEDED FOR ERECTILE DYSFUNCTION 10 tablet 0   No current facility-administered medications on file prior to visit.     BP 126/83   Pulse 70   Temp 98.1 F (36.7 C) (Oral)   Resp 16   Ht 6\' 2"  (1.88 m)   Wt 220 lb (99.8 kg)   SpO2 99%   BMI 28.25 kg/m       Objective:   Physical Exam  General Mental Status- Alert. General Appearance- Not in acute distress.   Skin General: Color- Normal Color. Moisture- Normal Moisture.  Neck Carotid Arteries- Normal color. Moisture- Normal Moisture. No carotid bruits. No JVD.  Chest and Lung Exam Auscultation: Breath Sounds:-Normal.  Cardiovascular Auscultation:Rythm- Regular. Murmurs & Other Heart Sounds:Auscultation of the heart reveals- No Murmurs.  Abdomen Inspection:-Inspeection Normal. Palpation/Percussion:Note:No mass. Palpation and Percussion of the abdomen reveal- Non Tender, Non Distended + BS, no rebound or guarding.   Neurologic Cranial Nerve exam:- CN III-XII intact(No nystagmus), symmetric smile. Strength:- 5/5 equal and symmetric strength both upper  and lower extremities.      Assessment & Plan:  Your blood pressure is very good today.  According to our computer records looks like he had not had a refill of amlodipine.  I want you to double check to see if you have been taking amlodipine.  If not then it  appears that you have good blood pressure control with diet and exercise.  But still check your blood pressure occasionally and make sure blood pressure less than 140/90.  If you find that you have been taking amlodipine then continue same dose.  Let me know if you need refills.  For high cholesterol, I do want you to get scheduled for fasting metabolic panel and lipid panel.  We will make sure that you are high-protein/low carb diet did not make your cholesterol increase.  Presently continue fenofibrate and atorvastatin.  Please try to stop smoking.  This is 1 of the main cardiovascular risk factors.  Your cardiovascular score is below.  The 10-year ASCVD risk score Denman George DC Montez Hageman., et al., 2013) is: 5.8%   Values used to calculate the score:     Age: 26 years     Sex: Male     Is Non-Hispanic African American: No     Diabetic: No     Tobacco smoker: Yes     Systolic Blood Pressure: 120 mmHg     Is BP treated: Yes     HDL Cholesterol: 51.5 mg/dL     Total Cholesterol: 171 mg/dL  The 58-ITGP ASCVD risk score Denman George DC Jr., et al., 2013) is: 5.8%   Values used to calculate the score:     Age: 29 years     Sex: Male     Is Non-Hispanic African American: No     Diabetic: No     Tobacco smoker: Yes     Systolic Blood Pressure: 120 mmHg     Is BP treated: Yes     HDL Cholesterol: 51.5 mg/dL     Total Cholesterol: 171 mg/dL  For GERD, continue omeprazole.  For anxiety, start BuSpar and give me update on how you are doing in 2 weeks.  For ED, I refilled your Revatio/Viagra.  Follow-up date to be determined after lab review.   40 minutes spent with pt 50% of time spent counseling on treatment plans for varius chronic conditions.  Counseled specifically on smoking cessation and ascvd score. Answered all of pt questions.  Esperanza Richters, PA-C

## 2018-02-07 NOTE — Patient Instructions (Addendum)
Good job on your weight loss and exercise.  Your blood pressure is very good today.  According to our computer records looks like he had not had a refill of amlodipine.  I want you to double check to see if you have been taking amlodipine.  If not then it appears that you have good blood pressure control with diet and exercise.  But still check your blood pressure occasionally and make sure blood pressure less than 140/90.  If you find that you have been taking amlodipine then continue same dose.  Let me know if you need refills.  For high cholesterol, I do want you to get scheduled for fasting metabolic panel and lipid panel.  We will make sure that you are high-protein/low carb diet did not make your cholesterol increase.  Presently continue fenofibrate and atorvastatin.  Please try to stop smoking.  This is 1 of the main cardiovascular risk factors.  Your cardiovascular score is below.  The 10-year ASCVD risk score Denman George DC Montez Hageman., et al., 2013) is: 5.8%   Values used to calculate the score:     Age: 48 years     Sex: Male     Is Non-Hispanic African American: No     Diabetic: No     Tobacco smoker: Yes     Systolic Blood Pressure: 120 mmHg     Is BP treated: Yes     HDL Cholesterol: 51.5 mg/dL     Total Cholesterol: 171 mg/dL  For GERD, continue omeprazole.  For anxiety, start BuSpar and give me update on how you are doing in 2 weeks.  For ED, I refilled your Revatio/Viagra.  Follow-up date to be determined after lab review.

## 2018-02-13 ENCOUNTER — Other Ambulatory Visit (INDEPENDENT_AMBULATORY_CARE_PROVIDER_SITE_OTHER): Payer: BLUE CROSS/BLUE SHIELD

## 2018-02-13 DIAGNOSIS — E785 Hyperlipidemia, unspecified: Secondary | ICD-10-CM

## 2018-02-13 LAB — COMPREHENSIVE METABOLIC PANEL
ALT: 44 U/L (ref 0–53)
AST: 28 U/L (ref 0–37)
Albumin: 4.3 g/dL (ref 3.5–5.2)
Alkaline Phosphatase: 58 U/L (ref 39–117)
BUN: 21 mg/dL (ref 6–23)
CHLORIDE: 104 meq/L (ref 96–112)
CO2: 27 mEq/L (ref 19–32)
Calcium: 9.5 mg/dL (ref 8.4–10.5)
Creatinine, Ser: 1.34 mg/dL (ref 0.40–1.50)
GFR: 56.6 mL/min — ABNORMAL LOW (ref >60.00)
Glucose, Bld: 96 mg/dL (ref 70–99)
Potassium: 4.1 mEq/L (ref 3.5–5.1)
Sodium: 141 mEq/L (ref 135–145)
Total Bilirubin: 0.3 mg/dL (ref 0.2–1.2)
Total Protein: 6.8 g/dL (ref 6.0–8.3)

## 2018-02-13 LAB — LIPID PANEL
Cholesterol: 148 mg/dL (ref 0–200)
HDL: 52.7 mg/dL (ref >39.00)
NonHDL: 95.15
Total CHOL/HDL Ratio: 3
Triglycerides: 252 mg/dL — ABNORMAL HIGH (ref 0.0–149.0)
VLDL: 50.4 mg/dL — AB (ref 0.0–40.0)

## 2018-02-13 LAB — LDL CHOLESTEROL, DIRECT: Direct LDL: 59 mg/dL

## 2018-02-19 ENCOUNTER — Other Ambulatory Visit: Payer: Self-pay | Admitting: Medical

## 2018-02-21 ENCOUNTER — Other Ambulatory Visit: Payer: Self-pay | Admitting: Medical

## 2018-02-21 MED ORDER — SILDENAFIL CITRATE 100 MG PO TABS: ORAL_TABLET | ORAL | 2 refills | Status: DC

## 2018-02-21 NOTE — Telephone Encounter (Signed)
Patient's wife called and asked for clarification on the Sildenafil 100 mg. I advised that Sildenafil 20 mg was sent on 02/07/18. She says they have to pay out of pocket for the 100 mg, a small amount only, and the insurance pays for the 20 mg, so that's why he uses both. She says a prior authorization is usually needed every year. I advised that the pharmacy will run it and if the prior authorization is needed, they will call the office.

## 2018-02-21 NOTE — Telephone Encounter (Signed)
Copied from CRM [id number]. Topic: Quick Communication - Rx Refill/Question >> Feb 21, 2018  4:54 PM Mickel Baas B, NT wrote: Medication: sildenafil (VIAGRA) 100 MG tablet  Has the patient contacted their pharmacy? Yes.   (Agent: If no, request that the patient contact the pharmacy for the refill.) (Agent: If yes, when and what did the pharmacy advise?)  Preferred Pharmacy (with phone number or street name): [id number] - [place], [place] - [address] AT [facility] [address]  Agent: Please be advised that RX refills may take up to 3 business days. We ask that you follow-up with your pharmacy.

## 2018-03-14 ENCOUNTER — Telehealth: Payer: Self-pay | Admitting: Medical

## 2018-03-14 NOTE — Telephone Encounter (Signed)
Copied from CRM [id number]. Topic: General - Other >> Mar 14, 2018  9:11 AM Leafy Ro wrote: Reason for CRM: pt is calling back with 2 wk update on buspar. Pt said the medication is working great and he is leaving to go to [place] on Saturday and would like a refill on buspar . [facility]. Pt saw edward on 02/07/2018

## 2018-03-21 ENCOUNTER — Other Ambulatory Visit: Payer: Self-pay | Admitting: Medical

## 2018-04-20 ENCOUNTER — Other Ambulatory Visit: Payer: Self-pay | Admitting: Medical

## 2018-05-23 ENCOUNTER — Telehealth: Payer: Self-pay | Admitting: *Deleted

## 2018-05-23 NOTE — Telephone Encounter (Signed)
Left detailed message on pt's voicemail to call and scheduled a Virtual f/u of anxiety with Ramon Dredge. Will need to explain process to pt and schedule visit when he returns call.

## 2018-05-28 NOTE — Telephone Encounter (Signed)
PT has not returned call regarding below message and he does not have mychart. Mailed letter.

## 2018-06-19 ENCOUNTER — Other Ambulatory Visit: Payer: Self-pay | Admitting: Medical

## 2018-06-20 ENCOUNTER — Other Ambulatory Visit: Payer: Self-pay

## 2018-06-20 ENCOUNTER — Ambulatory Visit (INDEPENDENT_AMBULATORY_CARE_PROVIDER_SITE_OTHER): Payer: BLUE CROSS/BLUE SHIELD | Admitting: Medical

## 2018-06-20 ENCOUNTER — Encounter: Payer: Self-pay | Admitting: Medical

## 2018-06-20 VITALS — HR 72 | Ht 74.0 in | Wt 210.0 lb

## 2018-06-20 DIAGNOSIS — E785 Hyperlipidemia, unspecified: Secondary | ICD-10-CM

## 2018-06-20 DIAGNOSIS — G5603 Carpal tunnel syndrome, bilateral upper limbs: Secondary | ICD-10-CM | POA: Diagnosis not present

## 2018-06-20 DIAGNOSIS — R5383 Other fatigue: Secondary | ICD-10-CM | POA: Diagnosis not present

## 2018-06-20 DIAGNOSIS — R635 Abnormal weight gain: Secondary | ICD-10-CM | POA: Diagnosis not present

## 2018-06-20 DIAGNOSIS — F419 Anxiety disorder, unspecified: Secondary | ICD-10-CM

## 2018-06-20 DIAGNOSIS — R252 Cramp and spasm: Secondary | ICD-10-CM | POA: Diagnosis not present

## 2018-06-20 MED ORDER — BUPROPION HCL ER (SR) 150 MG PO TB12: 150.0000 mg | ORAL_TABLET | Freq: Two times a day (BID) | ORAL | 3 refills | Status: DC

## 2018-06-20 MED ORDER — BUSPIRONE HCL 15 MG PO TABS: 15.0000 mg | ORAL_TABLET | Freq: Two times a day (BID) | ORAL | 3 refills | Status: DC

## 2018-06-20 NOTE — Patient Instructions (Addendum)
For fatigue, I placed future labs to be done within next week. Also make sure wife watches you when sleeping to see if you snore. If so then may need to refer to evaluate for sleep apnea.  For anxiety, I refilled buspar but at higher dose.  For CTS (possible), may give med /nsaid but need to get your bp reading to make sure not too high. Then can give med.  For cramping in calf muscles, I placed cmp and mg level.  For smoking cessation, refilled wellbutrin.  Follow up 3 weeks or as needed

## 2018-06-20 NOTE — Progress Notes (Signed)
   Subjective:    Patient ID: Christopher Pace, male    DOB: Jun 06, 1970, 48 y.o.   MRN: [id number]  HPI   Virtual Visit via Video Note  I connected with Christopher Pace on 06/20/18 at 10:20 AM EDT by a video enabled telemedicine application and verified that I am speaking with the correct person using two identifiers.  Location: Patient: home Provider: office.  Pt did not have bp cuff.   I discussed the limitations of evaluation and management by telemedicine and the availability of in person appointments. The patient expressed understanding and agreed to proceed.   History of Present Illness:  Pt has some recent anxiety. He had in the past and he states that buspar did work when he was taking the medication. He states buspar 7.5 mg tid was working well. Some periodic break through. He want to be on higher dose. He had struggled long time in past.  He also states wellbutrin helped him smoke less and he felt better overall.   Pt states sleeps better since flonase. Not congested.  Occasional tingling of both hands with certain positions. Sometimes feet feel cold and numb.   He also reports having some fatigue and sleeps more than    Observations/Objective: General-no acute distress, pleasant, oriented. Lungs- on inspection lungs appear unlabored. Neck- no tracheal deviation or jvd on inspection. Neuro- gross motor function appears intact.  Upper ext- easily induced phalen sign when he self performs test.  Assessment and Plan: For fatigue, I placed future labs to be done within next week. Also make sure wife watches you when sleeping to see if you snore. If so then may need to refer to evaluate for sleep apnea.  For anxiety, I refilled buspar but at higher dose.  For CTS (possible), may give med /nsaid but need to get your bp reading to make sure not too high. Then can give med.  For cramping in calf muscles, I placed cmp and mg level.  For cramping in calf muscles, I  placed cmp and mg level.  Follow up 3 weeks or as needed  Esperanza Richters, PA-C  Follow Up Instructions:    I discussed the assessment and treatment plan with the patient. The patient was provided an opportunity to ask questions and all were answered. The patient agreed with the plan and demonstrated an understanding of the instructions.   The patient was advised to call back or seek an in-person evaluation if the symptoms worsen or if the condition fails to improve as anticipated.       Esperanza Richters, PA-C    Review of Systems  Constitutional: Negative for chills, fatigue and fever.  HENT: Negative for congestion and ear pain.   Respiratory: Negative for cough, chest tightness, shortness of breath and wheezing.   Gastrointestinal: Negative for abdominal pain.  Musculoskeletal: Negative for back pain and myalgias.       See hpi.  Neurological: Negative for dizziness, seizures, speech difficulty, weakness and headaches.  Psychiatric/Behavioral: Negative for agitation, behavioral problems and dysphoric mood. The patient is nervous/anxious.        Objective:   Physical Exam        Assessment & Plan:

## 2018-06-26 ENCOUNTER — Other Ambulatory Visit: Payer: Self-pay

## 2018-06-26 ENCOUNTER — Other Ambulatory Visit (INDEPENDENT_AMBULATORY_CARE_PROVIDER_SITE_OTHER): Payer: BLUE CROSS/BLUE SHIELD

## 2018-06-26 DIAGNOSIS — E785 Hyperlipidemia, unspecified: Secondary | ICD-10-CM | POA: Diagnosis not present

## 2018-06-26 DIAGNOSIS — R252 Cramp and spasm: Secondary | ICD-10-CM | POA: Diagnosis not present

## 2018-06-26 DIAGNOSIS — R5383 Other fatigue: Secondary | ICD-10-CM

## 2018-06-26 LAB — CBC WITH DIFFERENTIAL/PLATELET
Basophils Absolute: 0.1 10*3/uL (ref 0.0–0.1)
Basophils Relative: 1 % (ref 0.0–3.0)
Eosinophils Absolute: 0.2 10*3/uL (ref 0.0–0.7)
Eosinophils Relative: 3.2 % (ref 0.0–5.0)
HCT: 43.1 % (ref 39.0–52.0)
Hemoglobin: 14.8 g/dL (ref 13.0–17.0)
Lymphocytes Relative: 22.6 % (ref 12.0–46.0)
Lymphs Abs: 1.7 10*3/uL (ref 0.7–4.0)
MCHC: 34.3 g/dL (ref 30.0–36.0)
MCV: 94.9 fl (ref 78.0–100.0)
Monocytes Absolute: 0.5 10*3/uL (ref 0.1–1.0)
Monocytes Relative: 7.1 % (ref 3.0–12.0)
Neutro Abs: 4.9 10*3/uL (ref 1.4–7.7)
Neutrophils Relative %: 66.1 % (ref 43.0–77.0)
Platelets: 195 10*3/uL (ref 150.0–400.0)
RBC: 4.54 Mil/uL (ref 4.22–5.81)
RDW: 13.6 % (ref 11.5–15.5)
WBC: 7.4 10*3/uL (ref 4.0–10.5)

## 2018-06-26 LAB — COMPREHENSIVE METABOLIC PANEL
ALT: 33 U/L (ref 0–53)
AST: 23 U/L (ref 0–37)
Albumin: 4.3 g/dL (ref 3.5–5.2)
Alkaline Phosphatase: 52 U/L (ref 39–117)
BUN: 14 mg/dL (ref 6–23)
CO2: 28 mEq/L (ref 19–32)
Calcium: 9.4 mg/dL (ref 8.4–10.5)
Chloride: 104 mEq/L (ref 96–112)
Creatinine, Ser: 1.17 mg/dL (ref 0.40–1.50)
GFR: 66.09 mL/min (ref >60.00)
Glucose, Bld: 101 mg/dL — ABNORMAL HIGH (ref 70–99)
Potassium: 4.8 mEq/L (ref 3.5–5.1)
Sodium: 140 mEq/L (ref 135–145)
Total Bilirubin: 0.3 mg/dL (ref 0.2–1.2)
Total Protein: 6.7 g/dL (ref 6.0–8.3)

## 2018-06-26 LAB — LIPID PANEL
Cholesterol: 163 mg/dL (ref 0–200)
HDL: 63.4 mg/dL (ref >39.00)
NonHDL: 99.72
Total CHOL/HDL Ratio: 3
Triglycerides: 309 mg/dL — ABNORMAL HIGH (ref 0.0–149.0)
VLDL: 61.8 mg/dL — ABNORMAL HIGH (ref 0.0–40.0)

## 2018-06-26 LAB — MAGNESIUM: Magnesium: 2 mg/dL (ref 1.5–2.5)

## 2018-06-26 LAB — VITAMIN B12: Vitamin B-12: 241 pg/mL (ref 211–911)

## 2018-06-26 LAB — LDL CHOLESTEROL, DIRECT: Direct LDL: 68 mg/dL

## 2018-06-26 LAB — TSH: TSH: 1.24 u[IU]/mL (ref 0.35–4.50)

## 2018-06-30 LAB — VITAMIN D 1,25 DIHYDROXY
Vitamin D 1, 25 (OH)2 Total: 63 pg/mL (ref 18–72)
Vitamin D2 1, 25 (OH)2: <8 pg/mL
Vitamin D3 1, 25 (OH)2: 63 pg/mL

## 2018-06-30 LAB — VITAMIN B1: Vitamin B1 (Thiamine): 17 nmol/L (ref 8–30)

## 2018-07-11 ENCOUNTER — Ambulatory Visit (HOSPITAL_BASED_OUTPATIENT_CLINIC_OR_DEPARTMENT_OTHER)
Admission: RE | Admit: 2018-07-11 | Discharge: 2018-07-11 | Disposition: A | Discharge status: Home / Self Care | Payer: BC Managed Care – PPO | Source: Ambulatory Visit | Attending: Medical | Admitting: Medical

## 2018-07-11 ENCOUNTER — Other Ambulatory Visit: Payer: Self-pay

## 2018-07-11 ENCOUNTER — Ambulatory Visit (INDEPENDENT_AMBULATORY_CARE_PROVIDER_SITE_OTHER): Payer: BC Managed Care – PPO | Admitting: Medical

## 2018-07-11 ENCOUNTER — Encounter: Payer: Self-pay | Admitting: Medical

## 2018-07-11 VITALS — BP 146/82 | HR 78

## 2018-07-11 DIAGNOSIS — G5600 Carpal tunnel syndrome, unspecified upper limb: Secondary | ICD-10-CM

## 2018-07-11 DIAGNOSIS — M25512 Pain in left shoulder: Secondary | ICD-10-CM | POA: Diagnosis not present

## 2018-07-11 DIAGNOSIS — M79602 Pain in left arm: Secondary | ICD-10-CM | POA: Diagnosis not present

## 2018-07-11 DIAGNOSIS — S199XXA Unspecified injury of neck, initial encounter: Secondary | ICD-10-CM | POA: Diagnosis not present

## 2018-07-11 DIAGNOSIS — M542 Cervicalgia: Secondary | ICD-10-CM | POA: Diagnosis not present

## 2018-07-11 DIAGNOSIS — S4992XA Unspecified injury of left shoulder and upper arm, initial encounter: Secondary | ICD-10-CM | POA: Diagnosis not present

## 2018-07-11 DIAGNOSIS — F17200 Nicotine dependence, unspecified, uncomplicated: Secondary | ICD-10-CM

## 2018-07-11 DIAGNOSIS — R252 Cramp and spasm: Secondary | ICD-10-CM | POA: Diagnosis not present

## 2018-07-11 MED ORDER — TRAMADOL HCL 50 MG PO TABS: 50.0000 mg | ORAL_TABLET | Freq: Four times a day (QID) | ORAL | 0 refills | Status: AC | PRN

## 2018-07-11 MED ORDER — MELOXICAM 7.5 MG PO TABS: 7.5000 mg | ORAL_TABLET | Freq: Every day | ORAL | 0 refills | Status: DC

## 2018-07-11 MED ORDER — CYCLOBENZAPRINE HCL 10 MG PO TABS: 10.0000 mg | ORAL_TABLET | Freq: Every day | ORAL | 0 refills | Status: DC

## 2018-07-11 NOTE — Patient Instructions (Addendum)
Patient no longer reporting fatigue and work-up for fatigue came back negative.  He reports a possible carpal tunnel symptoms now resolved.  No recurrent hand numbness.  Prior work-up for calf muscle cramping showed normal electrolytes and magnesium.  That has resolved as well.  For smoking cessation, patient reports that Wellbutrin has helped him decrease smoking significantly though he does still smoke some.  Mild sugar elevation on labs and counseled patient to reduce sugar in diet and get some daily exercise.  Patient has recent left lateral upper extremity pain near her humerus after fall along with some shoulder discomfort and lower mid cervical/neck region pain.  Some described a potential radicular pain.  Went ahead and placed x-ray orders for cervical spine, left shoulder and humerus.  Prescribe meloxicam for pain but want patient to check his blood pressure before starting meloxicam to make sure if less than 140/90.  If above 140/90 then advised not to take meloxicam.  Making Flexeril muscle relaxant available to use at night if he has some muscle spasms.  Also for moderate to severe pain, making tramadol available.  Follow-up in 7 to 10days or as needed.  And explained to patient that if his pain is persisting then will likely refer to sports medicine as at that point it would be about 3 weeks since injury.

## 2018-07-11 NOTE — Progress Notes (Signed)
Subjective:    Patient ID: Christopher Pace, male    DOB: 1970-06-03, 48 y.o.   MRN: [id number]  HPI  Virtual Visit via Video Note  I connected with Christopher Pace on 07/11/18 at  8:40 AM EDT by a video enabled telemedicine application and verified that I am speaking with the correct person using two identifiers.  Location: Patient: home Provider: home   Pt did not check his bp today.    I discussed the limitations of evaluation and management by telemedicine and the availability of in person appointments. The patient expressed understanding and agreed to proceed.  History of Present Illness: Pt visit for follow up 3 weeks ago visit. Below A/P for that visit.  For fatigue, I placed future labs to be done within next week. Also make sure wife watches you when sleeping to see if you snore. If so then may need to refer to evaluate for sleep apnea.  For anxiety, I refilled buspar but at higher dose.  For CTS (possible), may give med /nsaid but need to get your bp reading to make sure not too high. Then can give med.  For cramping in calf muscles, I placed cmp and mg level.  For smoking cessation, refilled wellbutrin.  His labs showed mild sugar elevation, Mild triglycerides elevated. B12, tsh, vit d, and b1 normal.   Pt states taking buspar prn for anxiety and it seems to help a lot.  He states new problem today. He states slipped 2 week in kitchen and he states above left elbow hit counter. Arm got hit mid humerus area on lateral aspect. He reports some shoulder pain but  worse pain arm. He explains neck and arm pain is worse. He had large bruise on arm at first.  He points to lower cervical spine as area of pain.  Pt states pain will keep him up at night.   Pt states cramps in calfs resolved.  Pt states numbness in hands went away as well.   Refilled his wellbutrin. He states he has decreased smoking alot   Observations/Objective:  General-no acute distress,  pleasant, oriented. Lungs- on inspection lungs appear unlabored. Neck- no tracheal deviation or jvd on inspection. Pt states lower mid neck pain. Points to this area. Neuro- gross motor function appears intact. Left shoulder- good rom. But mild pain on abductin of left upper ext.  Left arm- symmetric compared to right. No swelling. No bruise. Faint throbbing pain lateral aspect. No redness on insepction.   Assessment and Plan: Patient no longer reporting fatigue and work-up for fatigue came back negative.  He reports a possible carpal tunnel symptoms now resolved.  No recurrent hand numbness.  Prior work-up for calf muscle cramping showed normal electrolytes and magnesium.  That has resolved as well.  For smoking cessation, patient reports that Wellbutrin has helped him decrease smoking significantly though he does still smoke some.  Mild sugar elevation on labs and counseled patient to reduce sugar in diet and get some daily exercise.  Patient has recent left lateral upper extremity pain near her humerus after fall along with some shoulder discomfort and lower mid cervical/neck region pain.  Some described a potential radicular pain.  Went ahead and placed x-ray orders for cervical spine, left shoulder and humerus.  Prescribe meloxicam for pain but want patient to check his blood pressure before starting meloxicam to make sure if less than 140/90.  If above 140/90 then advised not to take meloxicam.  Making Flexeril  muscle relaxant available to use at night if he has some muscle spasms.  Also for moderate to severe pain, making tramadol available.  Follow-up in 7 to 10days or as needed.  And explained to patient that if his pain is persisting then will likely refer to sports medicine as at that point it would be about 3 weeks since injury.  Follow Up Instructions:    I discussed the assessment and treatment plan with the patient. The patient was provided an opportunity to ask  questions and all were answered. The patient agreed with the plan and demonstrated an understanding of the instructions.   The patient was advised to call back or seek an in-person evaluation if the symptoms worsen or if the condition fails to improve as anticipated.  I provided 25 minutes of non-face-to-face time during this encounter.  50% of time spent counseling patient on plan going forward for each condition and answering patient's questions.  Christopher RichtersEdward Nevena Rozenberg, PA-C   Review of Systems  Constitutional: Negative for chills, fatigue and fever.  HENT: Negative for congestion.   Respiratory: Negative for cough, chest tightness and stridor.   Cardiovascular: Negative for chest pain and palpitations.  Gastrointestinal: Negative for abdominal pain.  Musculoskeletal:       See HPI.  Neurological: Negative for dizziness, speech difficulty, weakness and light-headedness.  Hematological: Negative for adenopathy. Does not bruise/bleed easily.  Psychiatric/Behavioral: Negative for confusion.       Objective:   Physical Exam        Assessment & Plan:

## 2018-07-12 ENCOUNTER — Telehealth: Payer: Self-pay

## 2018-07-12 NOTE — Telephone Encounter (Signed)
Copied from [facility] [id number]. Topic: General - Other >> Jul 12, 2018  1:44 PM Celene Kras A wrote: Reason for CRM: Pt called is requesting to have his x ray results. Please advise.

## 2018-07-12 NOTE — Telephone Encounter (Signed)
Spoke w/ Pt- x-ray results given. Pt verbalized understanding.

## 2018-07-19 ENCOUNTER — Other Ambulatory Visit: Payer: Self-pay | Admitting: Medical

## 2018-11-23 ENCOUNTER — Other Ambulatory Visit: Payer: Self-pay | Admitting: *Deleted

## 2018-11-23 MED ORDER — ATORVASTATIN CALCIUM 40 MG PO TABS: ORAL_TABLET | ORAL | 1 refills | Status: DC

## 2018-12-05 ENCOUNTER — Telehealth: Payer: Self-pay | Admitting: Medical

## 2018-12-05 DIAGNOSIS — R635 Abnormal weight gain: Secondary | ICD-10-CM

## 2018-12-05 MED ORDER — BUPROPION HCL ER (SR) 150 MG PO TB12: 150.0000 mg | ORAL_TABLET | Freq: Two times a day (BID) | ORAL | 3 refills | Status: DC

## 2018-12-05 NOTE — Telephone Encounter (Signed)
°  Relation to pt: self  Call back number: [phone]  Pharmacy: [facility], [place] - [address] AT [address]    Reason for call:  Patient states pharmacy sent in a medication request for 1 week. Requesting patient anxiety medication (patient does not know the name of Rx), patient states he has 1 pill left and will be going out of town on Friday, patient would like a follow up call when Rx is sent, please advise

## 2018-12-06 ENCOUNTER — Other Ambulatory Visit: Payer: Self-pay

## 2018-12-06 ENCOUNTER — Ambulatory Visit (INDEPENDENT_AMBULATORY_CARE_PROVIDER_SITE_OTHER): Payer: BC Managed Care – PPO | Admitting: Medical

## 2018-12-06 ENCOUNTER — Encounter: Payer: Self-pay | Admitting: Medical

## 2018-12-06 VITALS — HR 98

## 2018-12-06 DIAGNOSIS — F419 Anxiety disorder, unspecified: Secondary | ICD-10-CM

## 2018-12-06 DIAGNOSIS — R739 Hyperglycemia, unspecified: Secondary | ICD-10-CM

## 2018-12-06 DIAGNOSIS — I10 Essential (primary) hypertension: Secondary | ICD-10-CM | POA: Diagnosis not present

## 2018-12-06 DIAGNOSIS — Z125 Encounter for screening for malignant neoplasm of prostate: Secondary | ICD-10-CM

## 2018-12-06 DIAGNOSIS — E785 Hyperlipidemia, unspecified: Secondary | ICD-10-CM | POA: Diagnosis not present

## 2018-12-06 DIAGNOSIS — Z87891 Personal history of nicotine dependence: Secondary | ICD-10-CM

## 2018-12-06 DIAGNOSIS — N529 Male erectile dysfunction, unspecified: Secondary | ICD-10-CM | POA: Diagnosis not present

## 2018-12-06 DIAGNOSIS — Z1211 Encounter for screening for malignant neoplasm of colon: Secondary | ICD-10-CM

## 2018-12-06 MED ORDER — BUSPIRONE HCL 15 MG PO TABS: 15.0000 mg | ORAL_TABLET | Freq: Two times a day (BID) | ORAL | 3 refills | Status: DC

## 2018-12-06 MED ORDER — SILDENAFIL CITRATE 100 MG PO TABS: ORAL_TABLET | ORAL | 2 refills | Status: DC

## 2018-12-06 NOTE — Progress Notes (Signed)
Subjective:    Patient ID: Christopher CriglerMichael S Ullom, male    DOB: 07-26-1970, 48 y.o.   MRN: [id number]  HPI  Virtual Visit via Video Note  I connected with Christopher Pace on 12/06/18 at  9:20 AM EST by a video enabled telemedicine application and verified that I am speaking with the correct person using two identifiers.  Location: Patient: home Provider: office.  Pt does not have bp monitor at home.    I discussed the limitations of evaluation and management by telemedicine and the availability of in person appointments. The patient expressed understanding and agreed to proceed.  History of Present Illness:   Pt has high bp. No check today. Last checked weeks ago at pharmacy he states less than 140/90.  Pt has high triglycerides. 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 He walks some days 25,000 steps. Has lost weight as well. States eating better. Not eating junk food. More healthy.  Some elevated sugar.  Pt states his anxiety is much better. He is still using buspar. More stress with heavy rains and less work.  Hx of smoking. rx'd to help smoking and he notes this does help his mood. He is still smoking.  Pt states gerd controlled for most part. This week mild worse since ate more tacos.   Has hx of ED.  Uses viagra 100 mg. He has tabs presently.    Past Medical History:  Diagnosis Date  . Alcohol abuse 08/08/2014  . Arthritis   . Depression   . Generalized anxiety disorder   . GERD (gastroesophageal reflux disease)      Social History   Socioeconomic History  . Marital status: Divorced    Spouse name: Not on file  . Number of children: Not on file  . Years of education: Not on file  . Highest education level: Not on file  Occupational History  . Not on file  Social Needs  . Financial resource strain: Not on file  . Food insecurity    Worry: Not on file    Inability: Not on file  . Transportation needs   Medical: Not on file    Non-medical: Not on file  Tobacco Use  . Smoking status: Current Every Day Smoker  . Smokeless tobacco: Never Used  Substance and Sexual Activity  . Alcohol use: Yes    Alcohol/week: 0.0 standard drinks    Comment: Pt states 5 days a week some drinking. More than a six pack.  . Drug use: Not on file  . Sexual activity: Yes  Lifestyle  . Physical activity    Days per week: Not on file    Minutes per session: Not on file  . Stress: Not on file  Relationships  . Social Musicianconnections    Talks on phone: Not on file    Gets together: Not on file    Attends religious service: Not on file    Active member of club or organization: Not on file    Attends meetings of clubs or organizations: Not on file    Relationship status: Not on file  . Intimate partner violence    Fear of current or ex partner: Not on file    Emotionally abused: Not on file    Physically abused: Not on file    Forced sexual activity: Not on file  Other Topics Concern  . Not on file  Social History Narrative  . Not  on file    Past Surgical History:  Procedure Laterality Date  . KNEE SURGERY     Hardware Placed due yo break    No family history on file.  No Known Allergies  Current Outpatient Medications on File Prior to Visit  Medication Sig Dispense Refill  . amLODipine (NORVASC) 10 MG tablet Take 1 tablet (10 mg total) daily by mouth. 30 tablet 0  . atorvastatin (LIPITOR) 40 MG tablet TAKE 1 TABLET(40 MG) BY MOUTH DAILY 90 tablet 1  . buPROPion (WELLBUTRIN SR) 150 MG 12 hr tablet TAKE 1 TABLET(150 MG) BY MOUTH TWICE DAILY 60 tablet 0  . buPROPion (WELLBUTRIN SR) 150 MG 12 hr tablet Take 1 tablet (150 mg total) by mouth 2 (two) times daily. 60 tablet 3  . cyclobenzaprine (FLEXERIL) 10 MG tablet Take 1 tablet (10 mg total) by mouth at bedtime. 10 tablet 0  . fenofibrate (TRICOR) 48 MG tablet Take 1 tablet (48 mg total) by mouth daily. 90 tablet 1  . fluticasone (FLONASE) 50 MCG/ACT  nasal spray Place 2 sprays into both nostrils daily. 16 g 5  . meclizine (ANTIVERT) 25 MG tablet TAKE 1 TABLET(25 MG) BY MOUTH THREE TIMES DAILY AS NEEDED FOR DIZZINESS 30 tablet 0  . meloxicam (MOBIC) 7.5 MG tablet Take 1 tablet (7.5 mg total) by mouth daily. 30 tablet 0  . omeprazole (PRILOSEC) 40 MG capsule TAKE 1 CAPSULE BY MOUTH EVERY DAY 30 capsule 11  . sildenafil (REVATIO) 20 MG tablet 2-5 tab po prior as needed prior to sex 50 tablet 3   No current facility-administered medications on file prior to visit.     Pulse 98 Comment: just after going up stairs.    Observations/Objective:  General-no acute distress, pleasant, oriented. Lungs- on inspection lungs appear unlabored. Neck- no tracheal deviation or jvd on inspection. Neuro- gross motor function appears intact.    Assessment and Plan: For htn, high cholesterol, elevated sugar, I want you to continue current med regimen and placed future labs to include cmp, lipid panel and a1c.  For smoking please try to quit or cut back significantly. Continue wellutrin.  For anxiety, refilled buspar.  Adding psa to future labs.  Referral to gi for colonoscopy placed.  For ED sent refill of viagra.  Follow up date to be determined after lab review.    Mackie Pai, PA-C  Follow Up Instructions:    I discussed the assessment and treatment plan with the patient. The patient was provided an opportunity to ask questions and all were answered. The patient agreed with the plan and demonstrated an understanding of the instructions.   The patient was advised to call back or seek an in-person evaluation if the symptoms worsen or if the condition fails to improve as anticipated.  I provided 25  minutes of non-face-to-face time during this encounter.   Mackie Pai, PA-C    Review of Systems  Constitutional: Negative for chills, fatigue and fever.  HENT: Negative for congestion, ear pain, sinus pressure and sinus pain.    Respiratory: Negative for cough, chest tightness, shortness of breath and wheezing.   Cardiovascular: Negative for chest pain and palpitations.  Gastrointestinal: Negative for abdominal pain.  Genitourinary: Negative for difficulty urinating, discharge, flank pain, frequency and penile pain.       ED  Skin: Negative for rash.  Neurological: Negative for dizziness, tremors and light-headedness.  Psychiatric/Behavioral: Positive for dysphoric mood. Negative for agitation, behavioral problems, confusion, sleep disturbance and suicidal ideas. The  patient is nervous/anxious.        No severe mood issues but notes wellbutrin seems to help mood.       Objective:   Physical Exam        Assessment & Plan:

## 2018-12-06 NOTE — Patient Instructions (Signed)
For htn, high cholesterol, elevated sugar, I want you to continue current med regimen and placed future labs to include cmp, lipid panel and a1c.  For smoking please try to quit or cut back significantly. Continue wellutrin.  For anxiety, refilled buspar.  Adding psa to future labs.  Referral to gi for colonoscopy placed.  For ED sent refill of viagra.  Follow up date to be determined after lab review.

## 2018-12-25 ENCOUNTER — Other Ambulatory Visit: Payer: Self-pay

## 2018-12-25 MED ORDER — FLUTICASONE PROPIONATE 50 MCG/ACT NA SUSP: 2.0000 | Freq: Every day | NASAL | 5 refills | Status: DC

## 2019-01-08 ENCOUNTER — Telehealth: Payer: Self-pay | Admitting: Medical

## 2019-01-08 ENCOUNTER — Encounter: Payer: Self-pay | Admitting: Medical

## 2019-01-08 DIAGNOSIS — Z1211 Encounter for screening for malignant neoplasm of colon: Secondary | ICD-10-CM

## 2019-01-08 NOTE — Telephone Encounter (Signed)
[facility] [place] Phone:  [phone]   Fax:  [phone]   GI tried to call pt. Will you notify pt of their phone number and advise for him to call them back.

## 2019-01-09 NOTE — Addendum Note (Signed)
Addended by: Kem Boroughs D on: 01/09/2019 10:29 AM   Modules accepted: Orders

## 2019-01-09 NOTE — Telephone Encounter (Signed)
Patient is anxious about the pandemic and scared.  Advised patient that they may have virtual visits available and just ask for virtual visit.  Patient agreed and referral placed again.

## 2019-03-05 ENCOUNTER — Encounter: Payer: Self-pay | Admitting: Medical

## 2019-04-17 ENCOUNTER — Other Ambulatory Visit: Payer: Self-pay | Admitting: Medical

## 2019-04-29 ENCOUNTER — Ambulatory Visit: Payer: BC Managed Care – PPO | Admitting: Family Medicine

## 2019-04-29 ENCOUNTER — Other Ambulatory Visit: Payer: Self-pay

## 2019-04-29 ENCOUNTER — Encounter: Payer: Self-pay | Admitting: Family Medicine

## 2019-04-29 ENCOUNTER — Ambulatory Visit (HOSPITAL_BASED_OUTPATIENT_CLINIC_OR_DEPARTMENT_OTHER)
Admission: RE | Admit: 2019-04-29 | Discharge: 2019-04-29 | Disposition: A | Discharge status: Home / Self Care | Payer: BC Managed Care – PPO | Source: Ambulatory Visit | Attending: Family Medicine | Admitting: Family Medicine

## 2019-04-29 VITALS — BP 108/80 | HR 68 | Temp 97.2°F | Resp 18 | Ht 74.0 in | Wt 223.6 lb

## 2019-04-29 DIAGNOSIS — M25571 Pain in right ankle and joints of right foot: Secondary | ICD-10-CM | POA: Insufficient documentation

## 2019-04-29 LAB — COMPREHENSIVE METABOLIC PANEL
ALT: 36 U/L (ref 0–53)
AST: 29 U/L (ref 0–37)
Albumin: 4.2 g/dL (ref 3.5–5.2)
Alkaline Phosphatase: 73 U/L (ref 39–117)
BUN: 19 mg/dL (ref 6–23)
CO2: 26 mEq/L (ref 19–32)
Calcium: 9.1 mg/dL (ref 8.4–10.5)
Chloride: 105 mEq/L (ref 96–112)
Creatinine, Ser: 1.1 mg/dL (ref 0.40–1.50)
GFR: 70.73 mL/min (ref >60.00)
Glucose, Bld: 91 mg/dL (ref 70–99)
Potassium: 4.8 mEq/L (ref 3.5–5.1)
Sodium: 138 mEq/L (ref 135–145)
Total Bilirubin: 0.3 mg/dL (ref 0.2–1.2)
Total Protein: 6.3 g/dL (ref 6.0–8.3)

## 2019-04-29 LAB — URIC ACID: Uric Acid, Serum: 5.4 mg/dL (ref 4.0–7.8)

## 2019-04-29 MED ORDER — MELOXICAM 15 MG PO TABS: ORAL_TABLET | ORAL | 0 refills | Status: DC

## 2019-04-29 NOTE — Patient Instructions (Signed)

## 2019-04-29 NOTE — Progress Notes (Signed)
Patient ID: Christopher Pace, male    DOB: 02-10-1970  Age: 49 y.o. MRN: [id number]    Subjective:  Subjective  HPI Christopher Pace presents for r ankle pain x 1 week.  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  Eyes: Negative for pain, redness and visual disturbance.  Respiratory: Negative for cough, chest tightness, shortness of breath and wheezing.   Cardiovascular: Negative for chest pain, palpitations and leg swelling.  Endocrine: Negative for cold intolerance, heat intolerance, polydipsia, polyphagia and polyuria.  Genitourinary: Negative for difficulty urinating, dysuria and frequency.  Musculoskeletal: Positive for gait problem and joint swelling.  Neurological: Negative for dizziness, light-headedness, numbness and headaches.    History Past Medical History:  Diagnosis Date  . Alcohol abuse 08/08/2014  . Arthritis   . Depression   . Generalized anxiety disorder   . GERD (gastroesophageal reflux disease)     He has a past surgical history that includes Knee surgery.   His family history is not on file.He reports that he has been smoking. He has never used smokeless tobacco. He reports current alcohol use. No history on file for drug.  Current Outpatient Medications on File Prior to Visit  Medication Sig Dispense Refill  . amLODipine (NORVASC) 10 MG tablet Take 1 tablet (10 mg total) daily by mouth. 30 tablet 0  . atorvastatin (LIPITOR) 40 MG tablet TAKE 1 TABLET(40 MG) BY MOUTH DAILY 90 tablet 1  . buPROPion (WELLBUTRIN SR) 150 MG 12 hr tablet TAKE 1 TABLET(150 MG) BY MOUTH TWICE DAILY 60 tablet 0  . buPROPion (WELLBUTRIN SR) 150 MG 12 hr tablet Take 1 tablet (150 mg total) by mouth 2 (two) times daily. 60 tablet 3  . busPIRone (BUSPAR) 15 MG tablet TAKE 1  TABLET(15 MG) BY MOUTH TWICE DAILY 60 tablet 3  . cyclobenzaprine (FLEXERIL) 10 MG tablet Take 1 tablet (10 mg total) by mouth at bedtime. 10 tablet 0  . fenofibrate (TRICOR) 48 MG tablet Take 1 tablet (48 mg total) by mouth daily. 90 tablet 1  . fluticasone (FLONASE) 50 MCG/ACT nasal spray Place 2 sprays into both nostrils daily. 16 g 5  . meclizine (ANTIVERT) 25 MG tablet TAKE 1 TABLET(25 MG) BY MOUTH THREE TIMES DAILY AS NEEDED FOR DIZZINESS 30 tablet 0  . meloxicam (MOBIC) 7.5 MG tablet Take 1 tablet (7.5 mg total) by mouth daily. 30 tablet 0  . omeprazole (PRILOSEC) 40 MG capsule TAKE 1 CAPSULE BY MOUTH EVERY DAY 30 capsule 11  . sildenafil (REVATIO) 20 MG tablet 2-5 tab po prior as needed prior to sex 50 tablet 3  . sildenafil (VIAGRA) 100 MG tablet TAKE 1 TABLET(100 MG) BY MOUTH DAILY AS NEEDED FOR ERECTILE DYSFUNCTION 10 tablet 2   No current facility-administered medications on file prior to visit.     Objective:  Objective  Physical Exam Vitals and nursing note reviewed.  Constitutional:      General: He is sleeping.     Appearance: He is well-developed.  HENT:     Head: Normocephalic and atraumatic.  Eyes:     Pupils: Pupils are equal, round, and reactive to light.  Neck:     Thyroid: No thyromegaly.  Cardiovascular:     Rate and Rhythm: Normal rate and  regular rhythm.     Heart sounds: No murmur.  Pulmonary:     Effort: Pulmonary effort is normal. No respiratory distress.     Breath sounds: Normal breath sounds. No wheezing or rales.  Chest:     Chest wall: No tenderness.  Musculoskeletal:        General: Swelling and tenderness present.     Cervical back: Normal range of motion and neck supple.     Right ankle: Swelling present. Tenderness present over the lateral malleolus, medial malleolus and posterior TF ligament.     Right Achilles Tendon: Tenderness present.     Left ankle: Normal.     Left Achilles Tendon: Normal.  Skin:    General: Skin is warm and dry.    Neurological:     Mental Status: He is oriented to person, place, and time.  Psychiatric:        Behavior: Behavior normal.        Thought Content: Thought content normal.        Judgment: Judgment normal.    BP 108/80 (BP Location: Right Arm, Patient Position: Sitting, Cuff Size: Large)   Pulse 68   Temp (!) 97.2 F (36.2 C) (Temporal)   Resp 18   Ht 6\' 2"  (1.88 m)   Wt 223 lb 9.6 oz (101.4 kg)   SpO2 98%   BMI 28.71 kg/m  Wt Readings from Last 3 Encounters:  04/29/19 223 lb 9.6 oz (101.4 kg)  06/20/18 210 lb (95.3 kg)  02/07/18 220 lb (99.8 kg)     Lab Results  Component Value Date   WBC 7.4 06/26/2018   HGB 14.8 06/26/2018   HCT 43.1 06/26/2018   PLT 195.0 06/26/2018   GLUCOSE 101 (H) 06/26/2018   CHOL 163 06/26/2018   TRIG 309.0 (H) 06/26/2018   HDL 63.40 06/26/2018   LDLDIRECT 68.0 06/26/2018   LDLCALC NOT CALC 09/02/2014   ALT 33 06/26/2018   AST 23 06/26/2018   NA 140 06/26/2018   K 4.8 06/26/2018   CL 104 06/26/2018   CREATININE 1.17 06/26/2018   BUN 14 06/26/2018   CO2 28 06/26/2018   TSH 1.24 06/26/2018    DG Cervical Spine Complete  Result Date: 07/11/2018 CLINICAL DATA:  Fall 2 weeks ago injuring left humerus. EXAM: CERVICAL SPINE - COMPLETE 4+ VIEW COMPARISON:  None. FINDINGS: Vertebral body alignment and heights are normal. There is mild spondylosis of the cervical spine. Prevertebral soft tissues are normal. No significant neural foraminal narrowing. No acute fracture or subluxation. Mild uncovertebral joint spurring is present. Atlantoaxial articulation is normal. IMPRESSION: No acute findings. Mild spondylosis of the cervical spine. Electronically Signed   By: 07/13/2018 M.D.   On: 07/11/2018 11:56   DG Shoulder Left  Result Date: 07/11/2018 CLINICAL DATA:  Fall 2 weeks ago with left shoulder/humerus pain. EXAM: LEFT SHOULDER - 2+ VIEW COMPARISON:  None. FINDINGS: There is no evidence of fracture or dislocation. There is no evidence of  arthropathy or other focal bone abnormality. Soft tissues are unremarkable. IMPRESSION: Negative. Electronically Signed   By: 07/13/2018 M.D.   On: 07/11/2018 11:57   DG Humerus Left  Result Date: 07/11/2018 CLINICAL DATA:  Fall 2 weeks ago with left arm pain. EXAM: LEFT HUMERUS - 2+ VIEW COMPARISON:  None. FINDINGS: There is no evidence of fracture or other focal bone lesions. Soft tissues are unremarkable. IMPRESSION: Negative. Electronically Signed   By: 07/13/2018 M.D.   On: 07/11/2018 11:57  Assessment & Plan:  Plan  I am having Christopher Pace start on meloxicam. I am also having him maintain his meclizine, amLODipine, buPROPion, sildenafil, fenofibrate, meloxicam, cyclobenzaprine, omeprazole, atorvastatin, buPROPion, sildenafil, fluticasone, and busPIRone.  Meds ordered this encounter  Medications  . meloxicam (MOBIC) 15 MG tablet    Sig: 1/2- 1 po qd    Dispense:  30 tablet    Refill:  0    Problem List Items Addressed This Visit    None    Visit Diagnoses    Acute right ankle pain    -  Primary   Relevant Medications   meloxicam (MOBIC) 15 MG tablet   Other Relevant Orders   DG Ankle Complete Right   Uric acid   Comprehensive metabolic panel    aso ankle brace applied--- xray  And labs Suspect gout  Consider sport med/ ortho if no better   Follow-up: Return if symptoms worsen or fail to improve.  Ann Held, DO

## 2019-04-30 ENCOUNTER — Telehealth: Payer: Self-pay | Admitting: Medical

## 2019-04-30 NOTE — Telephone Encounter (Signed)
Patient calling requesting results from xrays.

## 2019-04-30 NOTE — Telephone Encounter (Signed)
Patient called.

## 2019-05-07 ENCOUNTER — Other Ambulatory Visit: Payer: Self-pay

## 2019-05-07 ENCOUNTER — Ambulatory Visit: Payer: BC Managed Care – PPO | Admitting: Family Medicine

## 2019-05-07 ENCOUNTER — Telehealth: Payer: Self-pay

## 2019-05-07 ENCOUNTER — Ambulatory Visit: Payer: Self-pay

## 2019-05-07 VITALS — BP 124/70 | Ht 73.0 in | Wt 220.0 lb

## 2019-05-07 DIAGNOSIS — M25571 Pain in right ankle and joints of right foot: Secondary | ICD-10-CM

## 2019-05-07 DIAGNOSIS — M7751 Other enthesopathy of right foot: Secondary | ICD-10-CM | POA: Diagnosis not present

## 2019-05-07 MED ORDER — PREDNISONE 5 MG PO TABS: ORAL_TABLET | ORAL | 0 refills | Status: DC

## 2019-05-07 NOTE — Telephone Encounter (Signed)
Patient  Called in to see if PA Esperanza Richters  Could send a referral to an orthopedic Dr. Alan Ripper in net work for his ankle. Patient would like Ramon Dredge to give hime a call as soon as possible at [phone]

## 2019-05-07 NOTE — Assessment & Plan Note (Signed)
No changes of the Achilles appreciated it seems more likely related to impingement and retrocalcaneal bursitis.  Has an os trigonum on x-ray which could be causing some impingement with the amount of steps that he is doing each day. -Counseled on home exercise therapy and supportive care. -Cam walker. -Prednisone. -Could consider physical therapy or injection.

## 2019-05-07 NOTE — Progress Notes (Signed)
Christopher Pace - 48 y.o. male MRN [id number]  Date of birth: 1970/09/06  SUBJECTIVE:  Including CC & ROS.  No chief complaint on file.   Christopher Pace is a 48 y.o. male that is presenting with right ankle pain.  The pain started out of nowhere.  Is been ongoing for a few days.  No history of similar pain.  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  No specific inciting event or trauma.  Pain is severe at times.  No improvement with modalities to date..  Independent review right ankle x-ray on 4/5 shows no acute abnormality.   Review of Systems See HPI   HISTORY: Past Medical, Surgical, Social, and Family History Reviewed & Updated per EMR.   Pertinent Historical Findings include:  Past Medical History:  Diagnosis Date  . Alcohol abuse 08/08/2014  . Arthritis   . Depression   . Generalized anxiety disorder   . GERD (gastroesophageal reflux disease)     Past Surgical History:  Procedure Laterality Date  . KNEE SURGERY     Hardware Placed due yo break    No family history on file.  Social History   Socioeconomic History  . Marital status: Divorced    Spouse name: Not on file  . Number of children: Not on file  . Years of education: Not on file  . Highest education level: Not on file  Occupational History  . Not on file  Tobacco Use  . Smoking status: Current Every Day Smoker  . Smokeless tobacco: Never Used  Substance and Sexual Activity  . Alcohol use: Yes    Alcohol/week: 0.0 standard drinks    Comment: Pt states 5 days a week some drinking. More than a six pack.  . Drug use: Not on file  . Sexual activity: Yes  Other Topics Concern  . Not on file  Social History Narrative  . Not on file   Social Determinants of Health   Financial Resource Strain:   . Difficulty of Paying Living Expenses:   Food Insecurity:   . Worried About Programme researcher, broadcasting/film/video in the Last Year:   . Barista in the Last Year:   Transportation Needs:   .  Freight forwarder (Medical):   Marland Kitchen Lack of Transportation (Non-Medical):   Physical Activity:   . Days of Exercise per Week:   . Minutes of Exercise per Session:   Stress:   . Feeling of Stress :   Social Connections:   . Frequency of Communication with Friends and Family:   . Frequency of Social Gatherings with Friends and Family:   . Attends Religious Services:   . Active Member of Clubs or Organizations:   . Attends Banker Meetings:   Marland Kitchen Marital Status:   Intimate Partner Violence:   . Fear of Current or Ex-Partner:   . Emotionally Abused:   Marland Kitchen Physically Abused:   . Sexually Abused:      PHYSICAL EXAM:  VS: BP 124/70   Ht 6\' 1"  (1.854 m)   Wt 220 lb (99.8 kg)   BMI 29.03 kg/m  Physical Exam Gen: NAD, alert, cooperative with exam, well-appearing MSK:  Right ankle: Limited dorsiflexion and plantarflexion secondary to pain. No changes of the Achilles. Normal strength resistance. Pain upon standing. Neurovascularly intact   Limited ultrasound: Right ankle/foot:  No significant change of the posterior tibialis or at its insertion. No significant change of the  peroneal tendons at the lateral malleolus or at the insertion of the base. Retrocalcaneal bursitis present. Significant hyperemia in the posterior aspect to suggest impingement. Normal-appearing Achilles tendon  Summary: Retrocalcaneal bursitis and posterior compartment impingement  Ultrasound and interpretation by Clearance Coots, MD   ASSESSMENT & PLAN:   Retrocalcaneal bursitis (back of heel), right No changes of the Achilles appreciated it seems more likely related to impingement and retrocalcaneal bursitis.  Has an os trigonum on x-ray which could be causing some impingement with the amount of steps that he is doing each day. -Counseled on home exercise therapy and supportive care. -Cam walker. -Prednisone. -Could consider physical therapy or injection.

## 2019-05-07 NOTE — Telephone Encounter (Signed)
Referral placed.

## 2019-05-07 NOTE — Patient Instructions (Signed)
Nice to meet you Please continue ice  Please try the prednisone  Please try the boot while walking for work. You can take it off at home.  Please send me a message in MyChart with any questions or updates.  Please see me back in 4 weeks.   --Dr. Jordan Likes

## 2019-05-28 ENCOUNTER — Other Ambulatory Visit: Payer: Self-pay | Admitting: Medical

## 2019-06-03 ENCOUNTER — Ambulatory Visit: Payer: BC Managed Care – PPO | Admitting: Family Medicine

## 2019-06-27 ENCOUNTER — Other Ambulatory Visit: Payer: Self-pay | Admitting: Medical

## 2019-07-11 ENCOUNTER — Telehealth: Payer: Self-pay | Admitting: Medical

## 2019-07-11 MED ORDER — SILDENAFIL CITRATE 100 MG PO TABS: ORAL_TABLET | ORAL | 2 refills | Status: DC

## 2019-07-11 NOTE — Telephone Encounter (Signed)
Medication: sildenafil (VIAGRA) 100 MG tablet [[id number]]    Has the patient contacted their pharmacy? No. (If no, request that the patient contact the pharmacy for the refill.) (If yes, when and what did the pharmacy advise?)  Preferred Pharmacy (with phone number or street name):    Agent: Please be advised that RX refills may take up to 3 business days. We ask that you follow-up with your pharmacy.

## 2019-07-11 NOTE — Telephone Encounter (Signed)
Rx sent 

## 2019-07-27 ENCOUNTER — Other Ambulatory Visit: Payer: Self-pay | Admitting: Medical

## 2019-08-01 ENCOUNTER — Telehealth: Payer: Self-pay | Admitting: Medical

## 2019-08-01 MED ORDER — SILDENAFIL CITRATE 20 MG PO TABS: ORAL_TABLET | ORAL | 3 refills | Status: DC

## 2019-08-01 MED ORDER — BUSPIRONE HCL 15 MG PO TABS: 15.0000 mg | ORAL_TABLET | Freq: Two times a day (BID) | ORAL | 0 refills | Status: DC

## 2019-08-01 NOTE — Telephone Encounter (Signed)
Medication: busPIRone (BUSPAR) 15 MG tablet [[id number]]      Has the patient contacted their pharmacy?  (If no, request that the patient contact the pharmacy for the refill.) (If yes, when and what did the pharmacy advise?)     Preferred Pharmacy (with phone number or street name): [id number] - [place], [place] - [address] AT [facility] [address]  [address], [place] [address]  Phone:  [phone] Fax:  [phone]      Agent: Please be advised that RX refills may take up to 3 business days. We ask that you follow-up with your pharmacy.

## 2019-08-01 NOTE — Telephone Encounter (Signed)
Medication: sildenafil (VIAGRA) 100 MG tablet [[id number]]       Has the patient contacted their pharmacy?  (If no, request that the patient contact the pharmacy for the refill.) (If yes, when and what did the pharmacy advise?)     Preferred Pharmacy (with phone number or street name): [facility] [address] [place] [address] [phone]     Agent: Please be advised that RX refills may take up to 3 business days. We ask that you follow-up with your pharmacy.

## 2019-08-01 NOTE — Telephone Encounter (Signed)
Medication sent.

## 2019-09-06 ENCOUNTER — Other Ambulatory Visit: Payer: Self-pay | Admitting: Medical

## 2019-10-10 ENCOUNTER — Other Ambulatory Visit: Payer: Self-pay | Admitting: Medical

## 2019-10-31 ENCOUNTER — Other Ambulatory Visit: Payer: Self-pay | Admitting: Medical

## 2019-11-30 ENCOUNTER — Other Ambulatory Visit: Payer: Self-pay | Admitting: Medical

## 2019-12-30 ENCOUNTER — Other Ambulatory Visit: Payer: Self-pay | Admitting: Medical

## 2020-01-29 ENCOUNTER — Other Ambulatory Visit: Payer: Self-pay | Admitting: Medical

## 2020-04-16 ENCOUNTER — Ambulatory Visit (HOSPITAL_BASED_OUTPATIENT_CLINIC_OR_DEPARTMENT_OTHER)
Admission: RE | Admit: 2020-04-16 | Discharge: 2020-04-16 | Disposition: A | Discharge status: Home / Self Care | Payer: BC Managed Care – PPO | Source: Ambulatory Visit | Attending: Medical | Admitting: Medical

## 2020-04-16 ENCOUNTER — Telehealth: Payer: Self-pay | Admitting: Medical

## 2020-04-16 ENCOUNTER — Encounter: Payer: Self-pay | Admitting: Medical

## 2020-04-16 ENCOUNTER — Ambulatory Visit: Payer: BC Managed Care – PPO | Admitting: Medical

## 2020-04-16 ENCOUNTER — Other Ambulatory Visit: Payer: Self-pay

## 2020-04-16 VITALS — BP 140/90 | HR 86 | Temp 98.5°F | Resp 16 | Ht 74.0 in | Wt 212.0 lb

## 2020-04-16 DIAGNOSIS — E785 Hyperlipidemia, unspecified: Secondary | ICD-10-CM | POA: Diagnosis not present

## 2020-04-16 DIAGNOSIS — R739 Hyperglycemia, unspecified: Secondary | ICD-10-CM | POA: Diagnosis not present

## 2020-04-16 DIAGNOSIS — I10 Essential (primary) hypertension: Secondary | ICD-10-CM | POA: Diagnosis not present

## 2020-04-16 DIAGNOSIS — F419 Anxiety disorder, unspecified: Secondary | ICD-10-CM | POA: Diagnosis not present

## 2020-04-16 DIAGNOSIS — M25551 Pain in right hip: Secondary | ICD-10-CM

## 2020-04-16 DIAGNOSIS — F17200 Nicotine dependence, unspecified, uncomplicated: Secondary | ICD-10-CM

## 2020-04-16 DIAGNOSIS — Z1211 Encounter for screening for malignant neoplasm of colon: Secondary | ICD-10-CM

## 2020-04-16 DIAGNOSIS — N529 Male erectile dysfunction, unspecified: Secondary | ICD-10-CM

## 2020-04-16 DIAGNOSIS — K219 Gastro-esophageal reflux disease without esophagitis: Secondary | ICD-10-CM

## 2020-04-16 LAB — COMPREHENSIVE METABOLIC PANEL
ALT: 21 U/L (ref 0–53)
AST: 19 U/L (ref 0–37)
Albumin: 4.7 g/dL (ref 3.5–5.2)
Alkaline Phosphatase: 86 U/L (ref 39–117)
BUN: 16 mg/dL (ref 6–23)
CO2: 27 mEq/L (ref 19–32)
Calcium: 9.6 mg/dL (ref 8.4–10.5)
Chloride: 101 mEq/L (ref 96–112)
Creatinine, Ser: 1.07 mg/dL (ref 0.40–1.50)
GFR: 80.4 mL/min (ref >60.00)
Glucose, Bld: 100 mg/dL — ABNORMAL HIGH (ref 70–99)
Potassium: 4.9 mEq/L (ref 3.5–5.1)
Sodium: 138 mEq/L (ref 135–145)
Total Bilirubin: 0.3 mg/dL (ref 0.2–1.2)
Total Protein: 7.1 g/dL (ref 6.0–8.3)

## 2020-04-16 LAB — HEMOGLOBIN A1C: Hgb A1c MFr Bld: 5.6 % (ref 4.6–6.5)

## 2020-04-16 LAB — LIPID PANEL
Cholesterol: 203 mg/dL — ABNORMAL HIGH (ref 0–200)
HDL: 61 mg/dL (ref >39.00)
LDL Cholesterol: 110 mg/dL — ABNORMAL HIGH (ref 0–99)
NonHDL: 142.06
Total CHOL/HDL Ratio: 3
Triglycerides: 160 mg/dL — ABNORMAL HIGH (ref 0.0–149.0)
VLDL: 32 mg/dL (ref 0.0–40.0)

## 2020-04-16 MED ORDER — FENOFIBRATE 48 MG PO TABS: 48.0000 mg | ORAL_TABLET | Freq: Every day | ORAL | 1 refills | Status: DC

## 2020-04-16 MED ORDER — AMLODIPINE BESYLATE 10 MG PO TABS: 10.0000 mg | ORAL_TABLET | Freq: Every day | ORAL | 3 refills | Status: DC

## 2020-04-16 MED ORDER — BUSPIRONE HCL 15 MG PO TABS: 15.0000 mg | ORAL_TABLET | Freq: Three times a day (TID) | ORAL | 1 refills | Status: DC

## 2020-04-16 MED ORDER — SILDENAFIL CITRATE 100 MG PO TABS: ORAL_TABLET | ORAL | 2 refills | Status: DC

## 2020-04-16 MED ORDER — BUPROPION HCL ER (XL) 150 MG PO TB24: 150.0000 mg | ORAL_TABLET | Freq: Every day | ORAL | 0 refills | Status: DC

## 2020-04-16 MED ORDER — ATORVASTATIN CALCIUM 40 MG PO TABS: ORAL_TABLET | ORAL | 0 refills | Status: DC

## 2020-04-16 MED ORDER — OMEPRAZOLE 40 MG PO CPDR: 40.0000 mg | DELAYED_RELEASE_CAPSULE | Freq: Every day | ORAL | 1 refills | Status: DC

## 2020-04-16 MED ORDER — TRAMADOL HCL 50 MG PO TABS: 50.0000 mg | ORAL_TABLET | Freq: Three times a day (TID) | ORAL | 0 refills | Status: AC | PRN

## 2020-04-16 NOTE — Telephone Encounter (Signed)
Referral to sports med MD placed. °

## 2020-04-16 NOTE — Patient Instructions (Addendum)
History of anxiety and appears increased again as he has been off medications for some time.  Refilled BuSpar 15mg  3 times daily.  History of hypertension and blood pressure today 140/90.  Ideally would like to see her blood pressure 130/80 or a bit less than that.  Went ahead and refilled your amlodipine 10 mg daily dose.  History of hyperlipidemia and has been off medication as well.  Placed CMP and lipid panel today.  I refilled your atorvastatin and fenofibrate.  I recommend low-cholesterol diet.  Mild elevated sugar in the past.  Included A1c on labs today.  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  I recommend Tylenol for mild pain and making tramadol available for severe pain.  Rx advisement given.  Counseled on avoiding NSAIDs as that could increase your blood pressure as were trying to get that under control.  History of smoking.  Prescribed Wellbutrin.  Advised to gradually taper off smoking.  On review of your ADHD questionnaire scores you appear to have attention disorder.  Would recommend seeing if your attention improved with use of Wellbutrin.  Counseled that  stimulants can increase blood pressure and might increase anxiety.  Need to follow-up in 2 weeks to see how you are doing.  Also explained if prescribing stimulant then urine drug screen needs to be done as well as contract needs to be in place.  Also explained having to come in every 3 to 4 months as well as at least to get one urine drug screen yearly basis possibly more.  History of GERD.  Refill omeprazole.  Placed order for screening colonoscopy.    Follow-up around approximately April 11 or 12.  Or sooner if needed.

## 2020-04-16 NOTE — Addendum Note (Signed)
Addended by: Gwenevere Abbot on: 04/16/2020 12:30 PM   Modules accepted: Level of Service

## 2020-04-16 NOTE — Progress Notes (Signed)
Subjective:    Patient ID: Christopher Pace, male    DOB: 11-29-1970, 50 y.o.   MRN: [id number]  HPI  Pt in with rt hip pain for about 2 weeks. He states walking is painful. Pt not taking any med other than ibuprofen 200 mg tabs/3 tabs. No helping much. 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    History of high cholesterol. In past on atorvastin and tricor.  Hx of htn. Was on amlodipine in the past.   Pt is still smoking. Now pack every 3 days. Pt ran out of wellbutrin as well.   Pt in past was on buspar. He ran out.   Hx of gerd. He ran out of omeprazole.  Hx of ED. He needs refill of viagra.   Pt reports history of ADD. He states in past was on vyvanse. On review I don't see that?     Review of Systems  Constitutional: Negative for chills, fatigue and fever.  Respiratory: Negative for cough, chest tightness, shortness of breath and wheezing.   Cardiovascular: Negative for chest pain and palpitations.  Gastrointestinal: Negative for abdominal pain.  Genitourinary: Negative for enuresis.  Musculoskeletal: Negative for back pain and joint swelling.  Skin: Negative for rash.  Neurological: Negative for dizziness, weakness, numbness and headaches.  Hematological: Negative for adenopathy. Does not bruise/bleed easily.  Psychiatric/Behavioral: Positive for decreased concentration. Negative for behavioral problems, dysphoric mood, sleep disturbance and suicidal ideas. The patient is nervous/anxious.    Past Medical History:  Diagnosis Date  . Alcohol abuse 08/08/2014  . Arthritis   . Depression   . Generalized anxiety disorder   . GERD (gastroesophageal reflux disease)      Social History   Socioeconomic History  . Marital status: Divorced    Spouse name: Not on file  . Number of children: Not on file  . Years of education: Not on file  . Highest education level: Not on file  Occupational History  . Not on file  Tobacco Use  . Smoking  status: Current Every Day Smoker  . Smokeless tobacco: Never Used  Substance and Sexual Activity  . Alcohol use: Yes    Alcohol/week: 0.0 standard drinks    Comment: Pt states 5 days a week some drinking. More than a six pack.  . Drug use: Not on file  . Sexual activity: Yes  Other Topics Concern  . Not on file  Social History Narrative  . Not on file   Social Determinants of Health   Financial Resource Strain: Not on file  Food Insecurity: Not on file  Transportation Needs: Not on file  Physical Activity: Not on file  Stress: Not on file  Social Connections: Not on file  Intimate Partner Violence: Not on file    Past Surgical History:  Procedure Laterality Date  . KNEE SURGERY     Hardware Placed due yo break    No family history on file.  No Known Allergies  Current Outpatient Medications on File Prior to Visit  Medication Sig Dispense Refill  . amLODipine (NORVASC) 10 MG tablet Take 1 tablet (10 mg total) daily by mouth. 30 tablet 0  . buPROPion (WELLBUTRIN SR) 150 MG 12 hr tablet Take 1 tablet (150 mg total) by mouth 2 (two) times daily. 60 tablet 3  . busPIRone (BUSPAR) 15 MG tablet Take 1 tablet (15 mg total) by mouth 2 (two) times daily. 60 tablet 0  . fluticasone (FLONASE) 50  MCG/ACT nasal spray Place 2 sprays into both nostrils daily. 16 g 0  . sildenafil (REVATIO) 20 MG tablet 2-5 tab po prior as needed prior to sex 50 tablet 3   No current facility-administered medications on file prior to visit.    BP 140/90   Pulse 86   Temp 98.5 F (36.9 C) (Oral)   Resp 16   Ht 6\' 2"  (1.88 m)   Wt 212 lb (96.2 kg)   SpO2 98%   BMI 27.22 kg/m       Objective:   Physical Exam   General- No acute distress. Pleasant patient. Neck- Full range of motion, no jvd Lungs- Clear, even and unlabored. Heart- regular rate and rhythm. Neurologic- CNII- XII grossly intact.  Rt hip- pain on rom and on palpation. No crepitus.     Assessment & Plan:  History of  hypertension and blood pressure today 140/90.  Ideally would like to see her blood pressure 130/80 or a bit less than that.  Went ahead and refilled your amlodipine 10 mg daily dose.  History of hyperlipidemia and has been off medication as well.  Placed CMP and lipid panel today.  I refilled your atorvastatin and fenofibrate.  I recommend low-cholesterol diet.  Mild elevated sugar in the past.  Included A1c on labs today.  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  I recommend Tylenol for mild pain and making tramadol available for severe pain.  Rx advisement given.  Counseled on avoiding NSAIDs as that could increase your blood pressure as were trying to get that under control.  History of smoking.  Prescribed Wellbutrin.  Advised to gradually taper off smoking.  On review of your ADHD questionnaire scores you appear to have attention disorder.  Would recommend seeing if your attention improved with use of Wellbutrin.  Counseled that  stimulants can increase blood pressure and might increase anxiety.  Need to follow-up in 2 weeks to see how you are doing.  Also explained if prescribing stimulant then urine drug screen needs to be done as well as contract needs to be in place.  Also explained having to come in every 3 to 4 months as well as at least to get one urine drug screen yearly basis possibly more.  History of GERD.  Refill omeprazole.  Placed order for screening colonoscopy.  Follow-up around approximately April 11 or 12.  Or sooner if needed.   Time spent with patient today was 40  minutes which consisted of chart revsiew, discussing diagnoeis, work up treatment and documentation. Note last seen by myself 12/06/2018 so longer than typical visit addressing all prior dx as well as new hip pain.

## 2020-05-04 ENCOUNTER — Other Ambulatory Visit: Payer: Self-pay

## 2020-05-04 ENCOUNTER — Encounter: Payer: Self-pay | Admitting: Medical

## 2020-05-04 ENCOUNTER — Ambulatory Visit: Payer: BC Managed Care – PPO | Admitting: Medical

## 2020-05-04 VITALS — BP 115/70 | HR 76 | Resp 20 | Ht 74.0 in | Wt 214.0 lb

## 2020-05-04 DIAGNOSIS — K219 Gastro-esophageal reflux disease without esophagitis: Secondary | ICD-10-CM | POA: Diagnosis not present

## 2020-05-04 DIAGNOSIS — J309 Allergic rhinitis, unspecified: Secondary | ICD-10-CM | POA: Diagnosis not present

## 2020-05-04 DIAGNOSIS — I10 Essential (primary) hypertension: Secondary | ICD-10-CM

## 2020-05-04 DIAGNOSIS — G5603 Carpal tunnel syndrome, bilateral upper limbs: Secondary | ICD-10-CM

## 2020-05-04 DIAGNOSIS — M25551 Pain in right hip: Secondary | ICD-10-CM

## 2020-05-04 MED ORDER — LEVOCETIRIZINE DIHYDROCHLORIDE 5 MG PO TABS: 5.0000 mg | ORAL_TABLET | Freq: Every evening | ORAL | 3 refills | Status: AC

## 2020-05-04 MED ORDER — FLUTICASONE PROPIONATE 50 MCG/ACT NA SUSP: 2.0000 | Freq: Every day | NASAL | 3 refills | Status: DC

## 2020-05-04 NOTE — Progress Notes (Addendum)
Subjective:    Patient ID: Christopher Pace, male    DOB: 1970/10/09, 50 y.o.   MRN: [id number]  HPI  Pt is in for follow up.  Pt still has hip area pain. Last plan for hip.  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  I recommend Tylenol for mild pain and making tramadol available for severe pain.  Rx advisement given.  Counseled on avoiding NSAIDs as that could increase your blood pressure as were trying to get that under control."  Pt got message from sports med office.  I am reaching out from [facility]/ Dr. Riki Rusk Schmitz's office regarding a referral we received from PA, Ed. Jacson Rapaport for your Rt hip pain -- Please feel free to give [place] a call for scheduling anytime next week   Looking forward to meeting your needs & Thanks for your consideration,  [facility]/Scheduler @ [facility]  [address]  [place], [place] [address]   Pt needs refill of nasal spray. 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  BP is better today after restarting amlodipine.  History of gerd and on omeprazole.   Review of Systems  Constitutional: Negative for chills, fatigue and fever.  HENT: Negative for dental problem.   Respiratory: Negative for cough, chest tightness, shortness of breath and wheezing.   Cardiovascular: Negative for chest pain and palpitations.  Gastrointestinal: Negative for abdominal pain.  Genitourinary: Negative for difficulty urinating and enuresis.  Musculoskeletal: Negative for back pain, joint swelling, myalgias and neck stiffness.  Skin: Negative for rash.  Neurological: Negative for dizziness, seizures, weakness and light-headedness.       Both sides of hands will wake up with hands tingling. For about a month.  Hematological: Negative for adenopathy. Does not bruise/bleed easily.  Psychiatric/Behavioral: Negative for confusion.    Past Medical  History:  Diagnosis Date  . Alcohol abuse 08/08/2014  . Arthritis   . Depression   . Generalized anxiety disorder   . GERD (gastroesophageal reflux disease)      Social History   Socioeconomic History  . Marital status: Divorced    Spouse name: Not on file  . Number of children: Not on file  . Years of education: Not on file  . Highest education level: Not on file  Occupational History  . Not on file  Tobacco Use  . Smoking status: Current Every Day Smoker  . Smokeless tobacco: Never Used  Substance and Sexual Activity  . Alcohol use: Yes    Alcohol/week: 0.0 standard drinks    Comment: Pt states 5 days a week some drinking. More than a six pack.  . Drug use: Not on file  . Sexual activity: Yes  Other Topics Concern  . Not on file  Social History Narrative  . Not on file   Social Determinants of Health   Financial Resource Strain: Not on file  Food Insecurity: Not on file  Transportation Needs: Not on file  Physical Activity: Not on file  Stress: Not on file  Social Connections: Not on file  Intimate Partner Violence: Not on file    Past Surgical History:  Procedure Laterality Date  . KNEE SURGERY     Hardware Placed due yo break    No family history on file.  No Known Allergies  Current Outpatient Medications on File Prior to Visit  Medication Sig  Dispense Refill  . amLODipine (NORVASC) 10 MG tablet Take 1 tablet (10 mg total) daily by mouth. 30 tablet 0  . amLODipine (NORVASC) 10 MG tablet Take 1 tablet (10 mg total) by mouth daily. 90 tablet 3  . atorvastatin (LIPITOR) 40 MG tablet 1 tab po q day 90 tablet 0  . buPROPion (WELLBUTRIN SR) 150 MG 12 hr tablet Take 1 tablet (150 mg total) by mouth 2 (two) times daily. 60 tablet 3  . buPROPion (WELLBUTRIN XL) 150 MG 24 hr tablet Take 1 tablet (150 mg total) by mouth daily. 30 tablet 0  . busPIRone (BUSPAR) 15 MG tablet Take 1 tablet (15 mg total) by mouth 2 (two) times daily. 60 tablet 0  . busPIRone  (BUSPAR) 15 MG tablet Take 1 tablet (15 mg total) by mouth 3 (three) times daily. 90 tablet 1  . fenofibrate (TRICOR) 48 MG tablet Take 1 tablet (48 mg total) by mouth daily. 90 tablet 1  . omeprazole (PRILOSEC) 40 MG capsule Take 1 capsule (40 mg total) by mouth daily. 90 capsule 1  . sildenafil (REVATIO) 20 MG tablet 2-5 tab po prior as needed prior to sex 50 tablet 3  . sildenafil (VIAGRA) 100 MG tablet TAKE 1 TABLET(100 MG) BY MOUTH DAILY AS NEEDED FOR ERECTILE DYSFUNCTION 10 tablet 2   No current facility-administered medications on file prior to visit.    BP (!) 120/49   Pulse 76   Resp 20   Ht 6\' 2"  (1.88 m)   Wt 214 lb (97.1 kg)   SpO2 99%   BMI 27.48 kg/m       Objective:   Physical Exam  General Mental Status- Alert. General Appearance- Not in acute distress.   Skin General: Color- Normal Color. Moisture- Normal Moisture.  Neck Carotid Arteries- Normal color. Moisture- Normal Moisture. No carotid bruits. No JVD.  Chest and Lung Exam Auscultation: Breath Sounds:-Normal.  Cardiovascular Auscultation:Rythm- Regular. Murmurs & Other Heart Sounds:Auscultation of the heart reveals- No Murmurs.  Abdomen Inspection:-Inspeection Normal. Palpation/Percussion:Note:No mass. Palpation and Percussion of the abdomen reveal- Non Tender, Non Distended + BS, no rebound or guarding.   Neurologic Cranial Nerve exam:- CN III-XII intact(No nystagmus), symmetric smile. Strength:- 5/5 equal and symmetric strength both upper and lower extremities. On wrist manipulation finger tingling induced easily.      Assessment & Plan:  For rt hip pain see sports med. Recommend go over now to schedule.  For htn continue on amlodipine. Check bp at home. Today bp is tightly controlled. Stay hydrated at work. If bp consistently on lower side when you check such as lower than 110 systolic then recommend just 5 mg of amlodipine.  For gerd continue on omeprazole.  For allergic rhinitis rx  xyzal and flonase.  For carpal tunnel syndrome. Can discuss with sports med. Can use wrrist cock up splint and ibuprofen.  Follow up  3-6 months or as needed

## 2020-05-04 NOTE — Patient Instructions (Addendum)
For rt hip pain see sports med. Recommend go over now to schedule.  For htn continue on amlodipine. Check bp at home. Today bp is tightly controlled. Stay hydrated at work. If bp consistently on lower side when you check such as lower than 110 systolic then recommend just 5 mg of amlodipine.  For gerd continue on omeprazole.  For allergic rhinitis rx xyzal and flonase.  For carpal tunnel syndrome. Can discuss with sports med. Can use wrist cock up splint and ibuprofen.  Follow up  3-6 months or as needed

## 2020-05-05 ENCOUNTER — Ambulatory Visit: Payer: Self-pay

## 2020-05-05 ENCOUNTER — Ambulatory Visit: Payer: BC Managed Care – PPO | Admitting: Family Medicine

## 2020-05-05 ENCOUNTER — Other Ambulatory Visit: Payer: Self-pay

## 2020-05-05 VITALS — BP 122/70 | Ht 73.0 in | Wt 212.0 lb

## 2020-05-05 DIAGNOSIS — M25551 Pain in right hip: Secondary | ICD-10-CM

## 2020-05-05 DIAGNOSIS — M87051 Idiopathic aseptic necrosis of right femur: Secondary | ICD-10-CM | POA: Insufficient documentation

## 2020-05-05 DIAGNOSIS — M24159 Other articular cartilage disorders, unspecified hip: Secondary | ICD-10-CM | POA: Diagnosis not present

## 2020-05-05 MED ORDER — TRIAMCINOLONE ACETONIDE 40 MG/ML IJ SUSP
40.0000 mg | Freq: Once | INTRAMUSCULAR | Status: AC
  Administered 2020-05-05: 40 mg via INTRA_ARTICULAR

## 2020-05-05 NOTE — Progress Notes (Signed)
Christopher Pace - 49 y.o. male MRN [id number]  Date of birth: Jul 18, 1970  SUBJECTIVE:  Including CC & ROS.  No chief complaint on file.   Christopher Pace is a 50 y.o. male that is  Presenting with acute right hip pain. Has been ongoing for one month. Can be severe at times. Worse at the end of the day.  Independent review of the AP pelvis and right hip x-ray shows no acute changes.   Review of Systems See HPI   HISTORY: Past Medical, Surgical, Social, and Family History Reviewed & Updated per EMR.   Pertinent Historical Findings include:  Past Medical History:  Diagnosis Date  . Alcohol abuse 08/08/2014  . Arthritis   . Depression   . Generalized anxiety disorder   . GERD (gastroesophageal reflux disease)     Past Surgical History:  Procedure Laterality Date  . KNEE SURGERY     Hardware Placed due yo break    No family history on file.  Social History   Socioeconomic History  . Marital status: Divorced    Spouse name: Not on file  . Number of children: Not on file  . Years of education: Not on file  . Highest education level: Not on file  Occupational History  . Not on file  Tobacco Use  . Smoking status: Current Every Day Smoker  . Smokeless tobacco: Never Used  Substance and Sexual Activity  . Alcohol use: Yes    Alcohol/week: 0.0 standard drinks    Comment: Pt states 5 days a week some drinking. More than a six pack.  . Drug use: Not on file  . Sexual activity: Yes  Other Topics Concern  . Not on file  Social History Narrative  . Not on file   Social Determinants of Health   Financial Resource Strain: Not on file  Food Insecurity: Not on file  Transportation Needs: Not on file  Physical Activity: Not on file  Stress: Not on file  Social Connections: Not on file  Intimate Partner Violence: Not on file     PHYSICAL EXAM:  VS: BP 122/70   Ht 6\' 1"  (1.854 m)   Wt 212 lb (96.2 kg)   BMI 27.97 kg/m  Physical Exam Gen: NAD, alert, cooperative  with exam, well-appearing MSK:  Right hip: Pain with resistance to hip flexion. Pain with internal and external rotation. No swelling or ecchymosis. Neurovascularly intact  Limited ultrasound: Right hip:  There appears to be an effusion within the hip joint itself. There appears to be an overlying perilabral cyst that is superior of the hip joint.  No significant hyperemia around. No associated bursitis appreciated  Summary: Hip effusion with perilabral cyst.  Ultrasound and interpretation by , MD   Aspiration/Injection Procedure Note Christopher Pace 09/20/1970  Procedure: Injection Indications: Right hip pain  Procedure Details Consent: Risks of procedure as well as the alternatives and risks of each were explained to the (patient/caregiver).  Consent for procedure obtained. Time Out: Verified patient identification, verified procedure, site/side was marked, verified correct patient position, special equipment/implants available, medications/allergies/relevent history reviewed, required imaging and test results available.  Performed.  The area was cleaned with iodine and alcohol swabs.    The right hip joint was injected using 3 cc of 1% lidocaine on a 22-gauge 3-1/2 inch needle.  The syringe was switched to mixture containing 1 cc's of 40 mg Kenalog and 4 cc's of 0.25% bupivacaine was injected.  Ultrasound was used. Images  were obtained in long views showing the injection.     A sterile dressing was applied.  Patient did tolerate procedure well.    ASSESSMENT & PLAN:   Labral tear of hip, degenerative Having effusion as well as a paralabral cyst observed on ultrasound.  Does not appear to have avascular necrosis of the femoral head. -Counseled home exercise therapy and supportive care. -Injection. -Could consider physical therapy or further imaging.

## 2020-05-05 NOTE — Patient Instructions (Signed)
Good to see you Please try ice if needed  Please try the exercises   Please send me a message in MyChart with any questions or updates.  Please see me back in 4 weeks.   --Dr. Jordan Likes

## 2020-05-05 NOTE — Assessment & Plan Note (Signed)
Having effusion as well as a paralabral cyst observed on ultrasound.  Does not appear to have avascular necrosis of the femoral head. -Counseled home exercise therapy and supportive care. -Injection. -Could consider physical therapy or further imaging.

## 2020-05-12 ENCOUNTER — Encounter: Payer: Self-pay | Admitting: Family Medicine

## 2020-05-13 ENCOUNTER — Other Ambulatory Visit: Payer: Self-pay | Admitting: Medical

## 2020-05-13 ENCOUNTER — Other Ambulatory Visit: Payer: Self-pay | Admitting: Family Medicine

## 2020-05-13 DIAGNOSIS — M24159 Other articular cartilage disorders, unspecified hip: Secondary | ICD-10-CM

## 2020-05-13 MED ORDER — HYDROCODONE-ACETAMINOPHEN 5-325 MG PO TABS: 1.0000 | ORAL_TABLET | Freq: Three times a day (TID) | ORAL | 0 refills | Status: DC | PRN

## 2020-05-13 NOTE — Progress Notes (Signed)
Having ongoing pain despite conservative measures.  Having cyst and labral changes with concern for significant labral tear.  We will proceed with MRI to evaluate for labral tear.  Myra Rude, MD [facility] 05/13/2020, 10:59 AM

## 2020-06-10 ENCOUNTER — Other Ambulatory Visit: Payer: Self-pay | Admitting: Medical

## 2020-06-10 IMAGING — DX LEFT HUMERUS - 2+ VIEW
2 series · 2 of 2 positions shown · non-contrast
Comparison: None.

CLINICAL DATA: Fall 2 weeks ago with left arm pain.

EXAM:
LEFT HUMERUS - 2+ VIEW

[humerus ap]
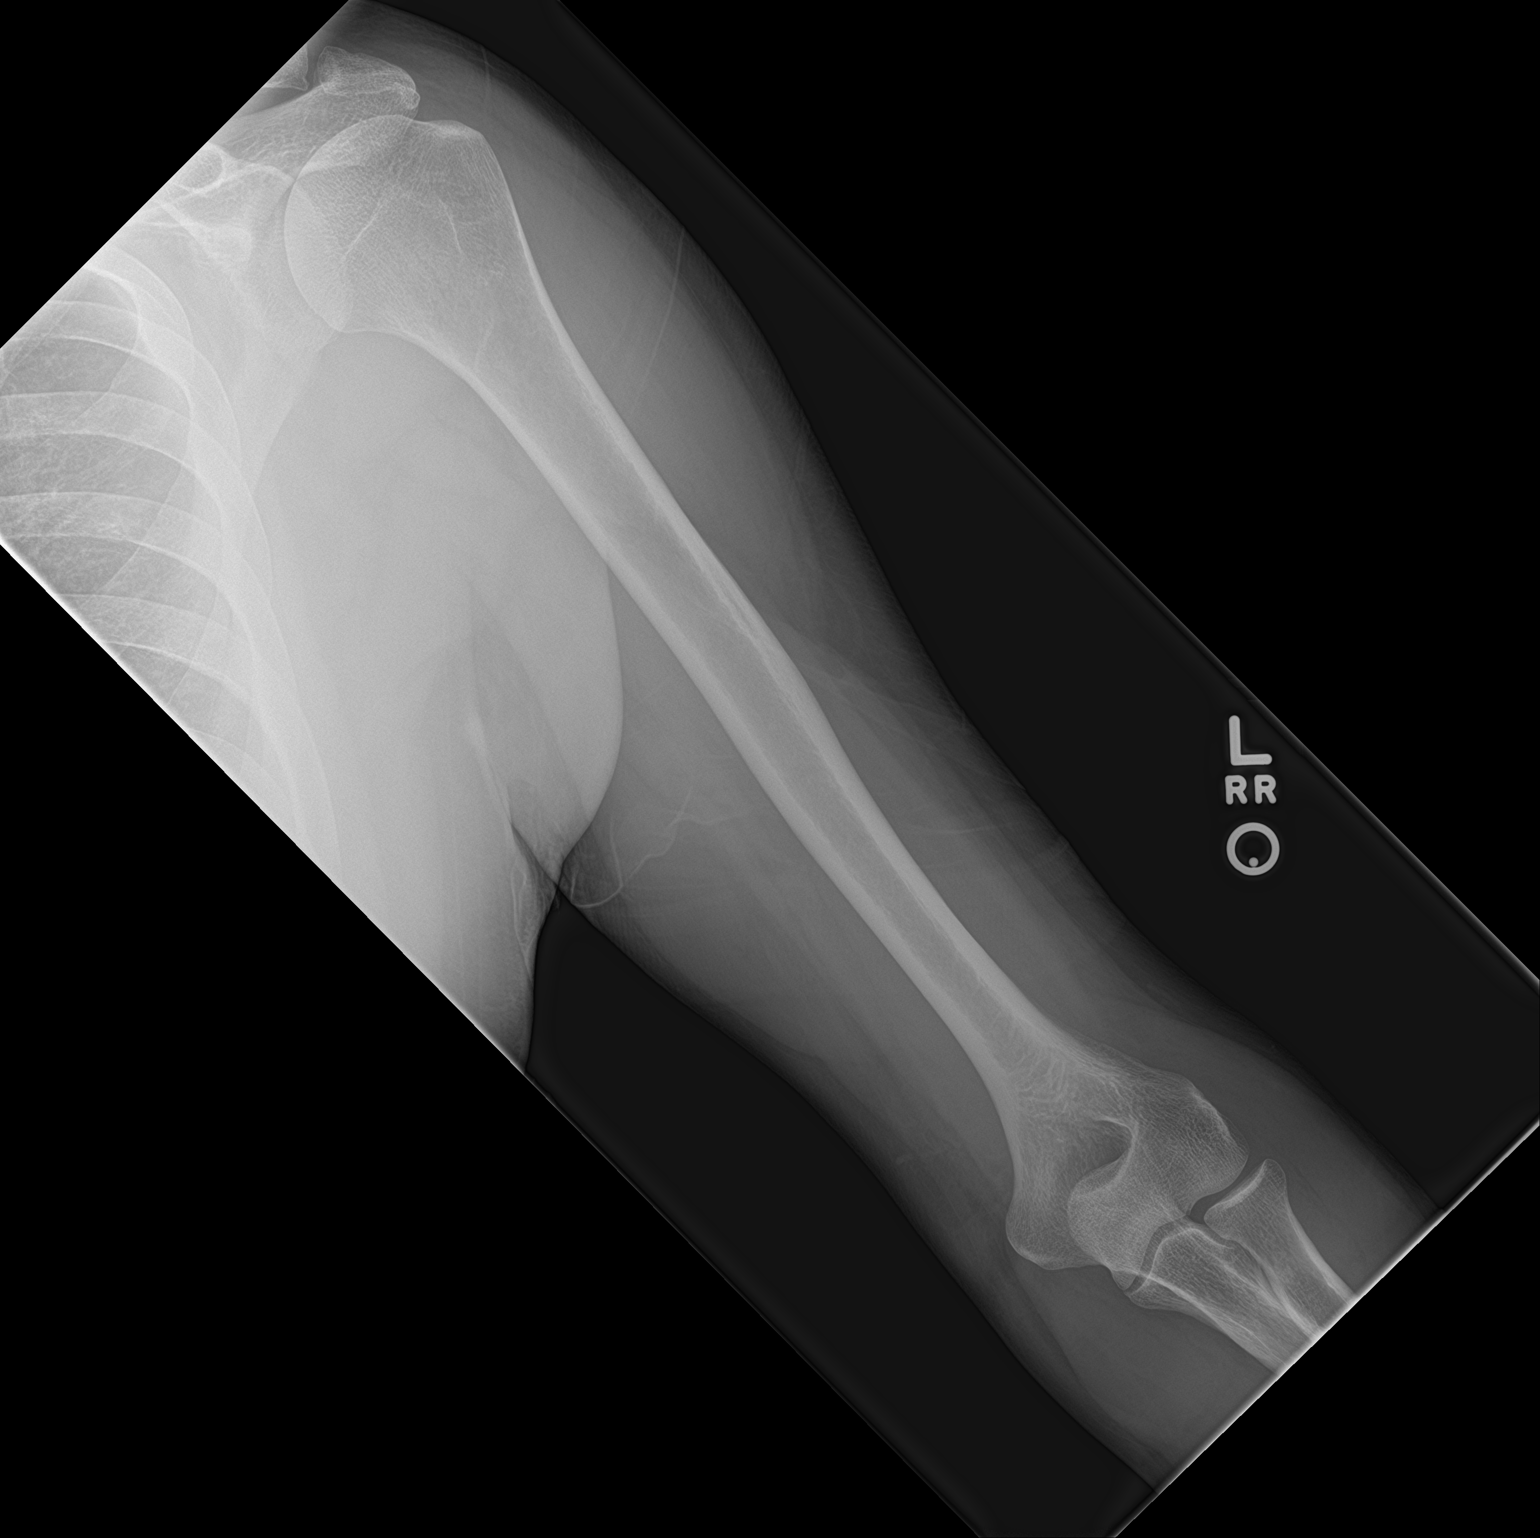

[humerus lat]
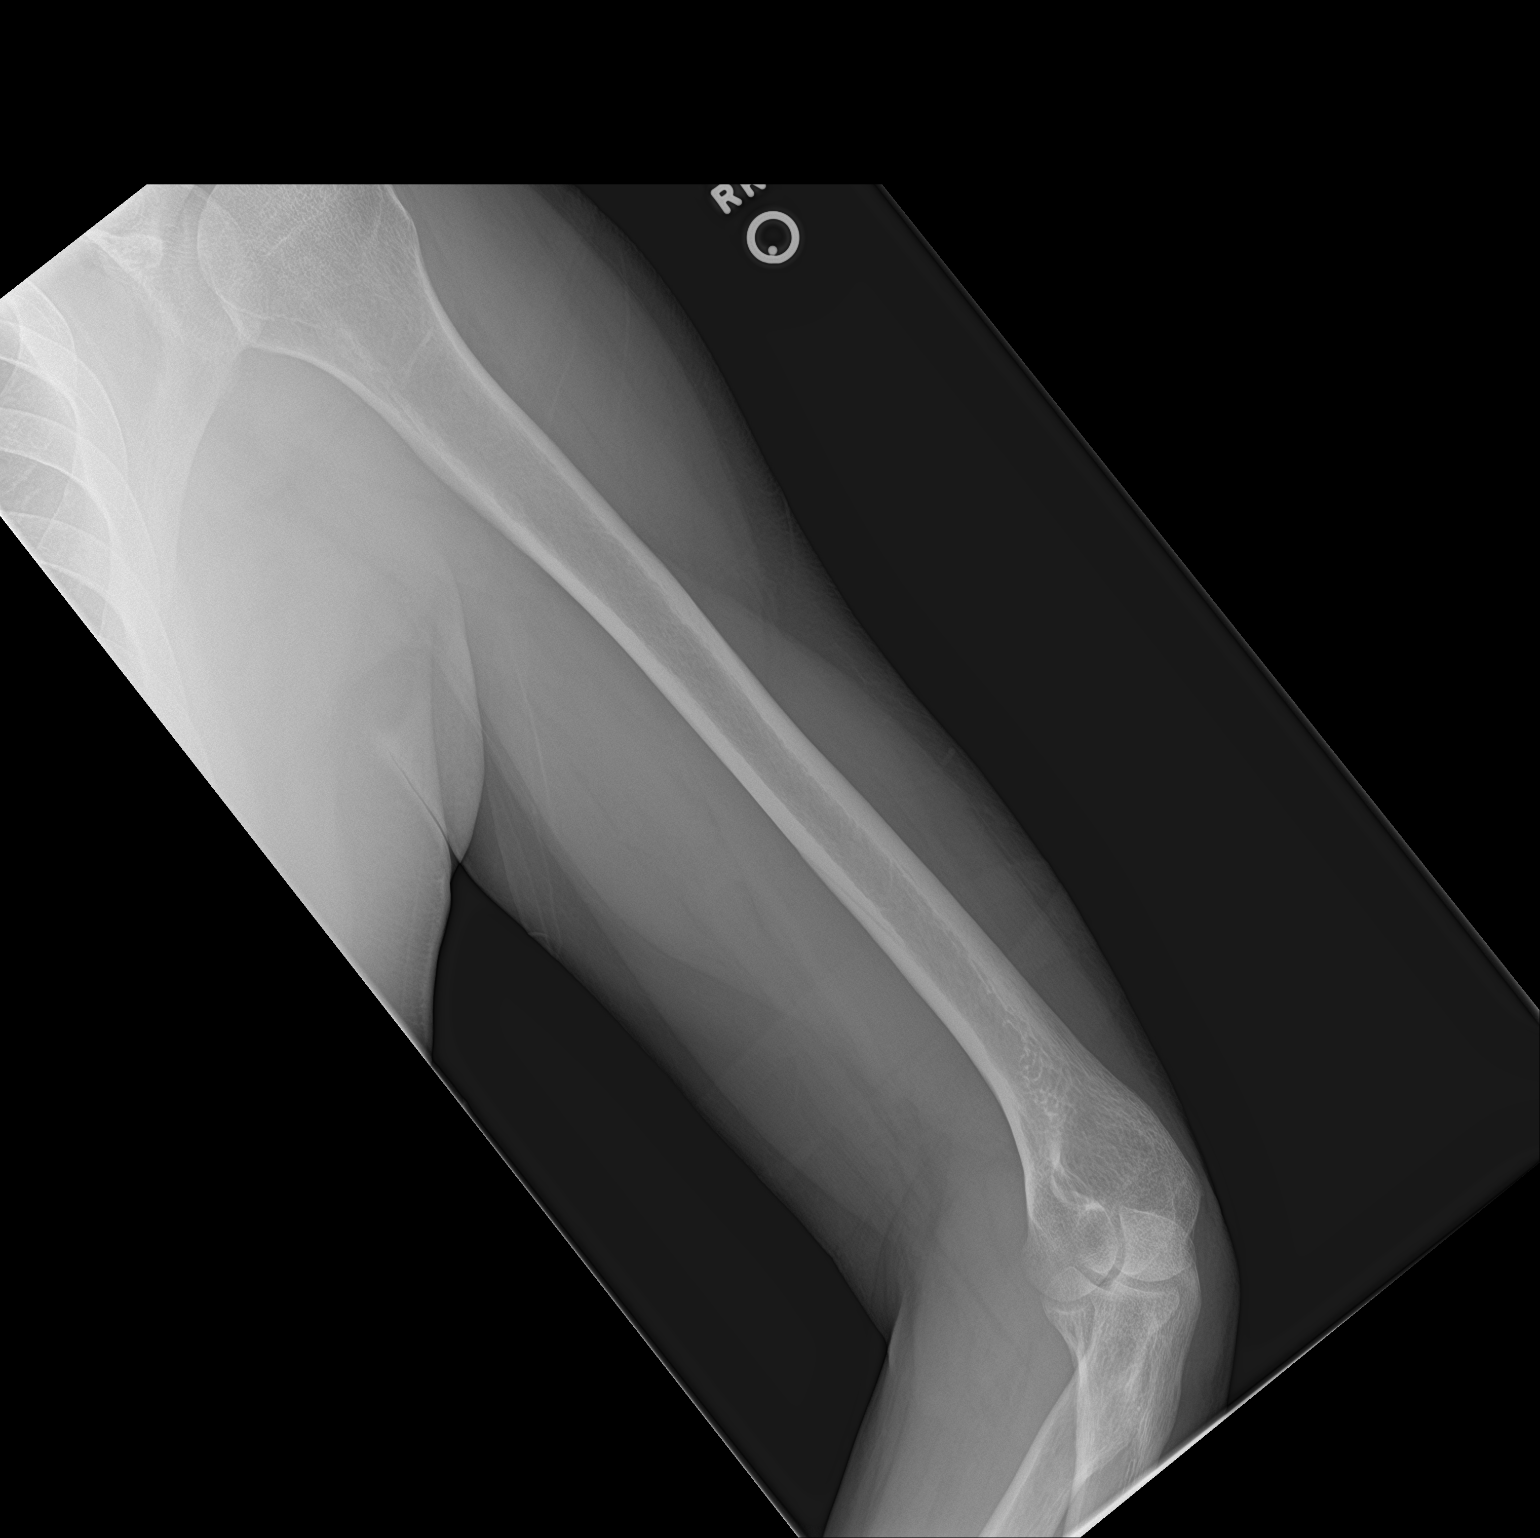

[2 of 2 positions shown; findings below may reference images not displayed]

FINDINGS: There is no evidence of fracture or other focal bone lesions. Soft
tissues are unremarkable.
IMPRESSION: Negative.

## 2020-07-27 ENCOUNTER — Other Ambulatory Visit: Payer: Self-pay | Admitting: Medical

## 2020-08-08 ENCOUNTER — Other Ambulatory Visit: Payer: Self-pay | Admitting: Medical

## 2020-08-26 ENCOUNTER — Other Ambulatory Visit: Payer: Self-pay | Admitting: Medical

## 2020-08-31 ENCOUNTER — Telehealth: Payer: Self-pay | Admitting: Family Medicine

## 2020-08-31 NOTE — Telephone Encounter (Signed)
Patient called states still having hip pain ( LOV 05/06/19) --says provider suggested MRI but he /patient didn't get done at that time due to other issues.  --Pt says now he wants MRI & to proceed w/ getting one approved, in the mean time he request some Pain Rx to be sent to :   [id number] - [place], [place] - [address] AT [facility] [address] Phone:  [phone]  Fax:  [phone]     --Forwarding request to provider for review.  --Please contact pt if there are any questions or concerns.  --glh

## 2020-09-01 MED ORDER — HYDROCODONE-ACETAMINOPHEN 5-325 MG PO TABS: 1.0000 | ORAL_TABLET | Freq: Three times a day (TID) | ORAL | 0 refills | Status: DC | PRN

## 2020-09-01 NOTE — Telephone Encounter (Signed)
Provided norco for pain. Will continue with ordered mri.   Myra Rude, MD [facility] 09/01/2020, 8:06 AM

## 2020-09-02 NOTE — Telephone Encounter (Signed)
Precert for right hip MRI w/o contrast pending review via AIM.  ORDER ID: [id number]. Anticipated determination date is 09/04/20.

## 2020-09-08 ENCOUNTER — Other Ambulatory Visit: Payer: Self-pay | Admitting: Medical

## 2020-09-10 ENCOUNTER — Encounter: Payer: Self-pay | Admitting: *Deleted

## 2020-09-10 NOTE — Telephone Encounter (Signed)
MRI is authorized from 09/02/20-10/01/20. Auth # is [id number]. Pt informed.

## 2020-09-19 ENCOUNTER — Other Ambulatory Visit: Payer: Self-pay

## 2020-09-19 ENCOUNTER — Ambulatory Visit
Admission: RE | Admit: 2020-09-19 | Discharge: 2020-09-19 | Disposition: A | Discharge status: Home / Self Care | Payer: BC Managed Care – PPO | Source: Ambulatory Visit | Attending: Family Medicine | Admitting: Family Medicine

## 2020-09-19 DIAGNOSIS — M25551 Pain in right hip: Secondary | ICD-10-CM | POA: Diagnosis not present

## 2020-09-19 DIAGNOSIS — M24159 Other articular cartilage disorders, unspecified hip: Secondary | ICD-10-CM

## 2020-09-21 ENCOUNTER — Telehealth: Payer: BC Managed Care – PPO | Admitting: Family Medicine

## 2020-09-22 ENCOUNTER — Other Ambulatory Visit: Payer: Self-pay | Admitting: Medical

## 2020-09-22 ENCOUNTER — Other Ambulatory Visit: Payer: Self-pay

## 2020-09-22 ENCOUNTER — Telehealth (INDEPENDENT_AMBULATORY_CARE_PROVIDER_SITE_OTHER): Payer: BC Managed Care – PPO | Admitting: Family Medicine

## 2020-09-22 DIAGNOSIS — M87051 Idiopathic aseptic necrosis of right femur: Secondary | ICD-10-CM | POA: Diagnosis not present

## 2020-09-22 MED ORDER — HYDROCODONE-ACETAMINOPHEN 5-325 MG PO TABS: 1.0000 | ORAL_TABLET | Freq: Three times a day (TID) | ORAL | 0 refills | Status: DC | PRN

## 2020-09-22 NOTE — Assessment & Plan Note (Signed)
Avascular necrosis observed of the hip.  Acute on chronic in nature and has been significantly painful for many months. -Counseled on home exercise therapy and supportive care. -Norco. -Referral to surgery.

## 2020-09-22 NOTE — Progress Notes (Signed)
Virtual Visit via Video Note  I connected with Christopher Pace on 09/22/20 at  8:00 AM EDT by a video enabled telemedicine application and verified that I am speaking with the correct person using two identifiers.  Location: Patient: home Provider: office   I discussed the limitations of evaluation and management by telemedicine and the availability of in person appointments. The patient expressed understanding and agreed to proceed.  History of Present Illness:  Christopher Pace is a 50 year old male that is following up after the MRI of his right hip.  The pain is acute on chronic in nature and he has been experiencing it for several months.  Has tried a hip joint injection last year.  MRI was demonstrating avascular necrosis of the femoral head.   Observations/Objective:   Assessment and Plan:  Avascular necrosis of right hip: Avascular necrosis observed of the hip.  Acute on chronic in nature and has been significantly painful for many months. -Counseled on home exercise therapy and supportive care. -Norco. -Referral to surgery.  Follow Up Instructions:    I discussed the assessment and treatment plan with the patient. The patient was provided an opportunity to ask questions and all were answered. The patient agreed with the plan and demonstrated an understanding of the instructions.   The patient was advised to call back or seek an in-person evaluation if the symptoms worsen or if the condition fails to improve as anticipated.    Clare Gandy, MD

## 2020-10-05 ENCOUNTER — Encounter: Payer: Self-pay | Admitting: Family Medicine

## 2020-10-06 ENCOUNTER — Other Ambulatory Visit: Payer: Self-pay

## 2020-10-06 ENCOUNTER — Encounter: Payer: Self-pay | Admitting: Family Medicine

## 2020-10-06 ENCOUNTER — Telehealth (INDEPENDENT_AMBULATORY_CARE_PROVIDER_SITE_OTHER): Payer: BC Managed Care – PPO | Admitting: Family Medicine

## 2020-10-06 DIAGNOSIS — M87051 Idiopathic aseptic necrosis of right femur: Secondary | ICD-10-CM | POA: Diagnosis not present

## 2020-10-06 MED ORDER — HYDROCODONE-ACETAMINOPHEN 5-325 MG PO TABS: 1.0000 | ORAL_TABLET | Freq: Three times a day (TID) | ORAL | 0 refills | Status: DC | PRN

## 2020-10-06 NOTE — Assessment & Plan Note (Signed)
Acute on chronic in nature.  Counseled and discussed surgery as can be the best long-term option. -Counseled on home exercise therapy and supportive care. -Norco.

## 2020-10-06 NOTE — Progress Notes (Signed)
Virtual Visit via Video Note  I connected with Christopher Pace on 10/06/20 at 10:10 AM EDT by a video enabled telemedicine application and verified that I am speaking with the correct person using two identifiers.  Location: Patient: home Provider: office   I discussed the limitations of evaluation and management by telemedicine and the availability of in person appointments. The patient expressed understanding and agreed to proceed.  History of Present Illness:  Mr. Hinojosa is a 50 year old male that is following up for his right hip pain.  Continues to have pain and has not seen the surgeon yet.   Observations/Objective:   Assessment and Plan:  Avascular necrosis of right hip: Acute on chronic in nature.  Counseled and discussed surgery as can be the best long-term option. -Counseled on home exercise therapy and supportive care. -Norco.  Follow Up Instructions:    I discussed the assessment and treatment plan with the patient. The patient was provided an opportunity to ask questions and all were answered. The patient agreed with the plan and demonstrated an understanding of the instructions.   The patient was advised to call back or seek an in-person evaluation if the symptoms worsen or if the condition fails to improve as anticipated.    Clare Gandy, MD

## 2020-11-02 ENCOUNTER — Other Ambulatory Visit: Payer: Self-pay | Admitting: Medical

## 2020-11-10 ENCOUNTER — Telehealth: Payer: Self-pay | Admitting: Family Medicine

## 2020-11-10 MED ORDER — HYDROCODONE-ACETAMINOPHEN 5-325 MG PO TABS: 1.0000 | ORAL_TABLET | Freq: Three times a day (TID) | ORAL | 0 refills | Status: AC | PRN

## 2020-11-10 NOTE — Telephone Encounter (Signed)
Refilled norco.   Myra Rude, MD [facility] 11/10/2020, 2:47 PM

## 2020-11-10 NOTE — Telephone Encounter (Signed)
Pt called while office clsd for lunch,left message request refill on pain meds(says Orthopedic Surgeon appt next month & he needs some pain meds till then.  --Forwarding request to provider that if approved send refill order to :   [id number] - [place], [place] - [address] AT [facility] OF [address] Phone:  [phone]  Fax:  [phone]    --glh

## 2020-11-11 DIAGNOSIS — M25551 Pain in right hip: Secondary | ICD-10-CM | POA: Diagnosis not present

## 2020-11-11 DIAGNOSIS — M87859 Other osteonecrosis, unspecified femur: Secondary | ICD-10-CM | POA: Diagnosis not present

## 2020-11-19 ENCOUNTER — Telehealth: Payer: Self-pay | Admitting: Medical

## 2020-11-19 NOTE — Telephone Encounter (Signed)
Opened to review 

## 2020-11-19 NOTE — Telephone Encounter (Signed)
Pt needs to come in for preeop clearance exam visit. Advise to come in fasting 8 hours.. Will do preop evaluation labs and include lipid panel to check high lipids in past.

## 2020-11-19 NOTE — Telephone Encounter (Signed)
Pt scheduled  

## 2020-11-24 ENCOUNTER — Other Ambulatory Visit: Payer: Self-pay

## 2020-11-24 ENCOUNTER — Ambulatory Visit: Payer: BC Managed Care – PPO | Admitting: Medical

## 2020-11-24 VITALS — BP 113/83 | HR 79 | Resp 18 | Ht 73.0 in | Wt 206.0 lb

## 2020-11-24 DIAGNOSIS — Z01818 Encounter for other preprocedural examination: Secondary | ICD-10-CM

## 2020-11-24 DIAGNOSIS — R739 Hyperglycemia, unspecified: Secondary | ICD-10-CM

## 2020-11-24 DIAGNOSIS — I10 Essential (primary) hypertension: Secondary | ICD-10-CM | POA: Diagnosis not present

## 2020-11-24 DIAGNOSIS — M87051 Idiopathic aseptic necrosis of right femur: Secondary | ICD-10-CM

## 2020-11-24 DIAGNOSIS — F419 Anxiety disorder, unspecified: Secondary | ICD-10-CM

## 2020-11-24 DIAGNOSIS — K219 Gastro-esophageal reflux disease without esophagitis: Secondary | ICD-10-CM | POA: Diagnosis not present

## 2020-11-24 DIAGNOSIS — J309 Allergic rhinitis, unspecified: Secondary | ICD-10-CM

## 2020-11-24 DIAGNOSIS — F17200 Nicotine dependence, unspecified, uncomplicated: Secondary | ICD-10-CM

## 2020-11-24 DIAGNOSIS — E785 Hyperlipidemia, unspecified: Secondary | ICD-10-CM | POA: Diagnosis not present

## 2020-11-24 LAB — CBC WITH DIFFERENTIAL/PLATELET
Basophils Absolute: 0.1 10*3/uL (ref 0.0–0.1)
Basophils Relative: 0.9 % (ref 0.0–3.0)
Eosinophils Absolute: 0.4 10*3/uL (ref 0.0–0.7)
Eosinophils Relative: 4.4 % (ref 0.0–5.0)
HCT: 43.8 % (ref 39.0–52.0)
Hemoglobin: 14.6 g/dL (ref 13.0–17.0)
Lymphocytes Relative: 19.3 % (ref 12.0–46.0)
Lymphs Abs: 1.7 10*3/uL (ref 0.7–4.0)
MCHC: 33.4 g/dL (ref 30.0–36.0)
MCV: 97 fl (ref 78.0–100.0)
Monocytes Absolute: 0.6 10*3/uL (ref 0.1–1.0)
Monocytes Relative: 6.7 % (ref 3.0–12.0)
Neutro Abs: 6 10*3/uL (ref 1.4–7.7)
Neutrophils Relative %: 68.7 % (ref 43.0–77.0)
Platelets: 247 10*3/uL (ref 150.0–400.0)
RBC: 4.52 Mil/uL (ref 4.22–5.81)
RDW: 13.5 % (ref 11.5–15.5)
WBC: 8.8 10*3/uL (ref 4.0–10.5)

## 2020-11-24 LAB — LIPID PANEL
Cholesterol: 185 mg/dL (ref 0–200)
HDL: 67.9 mg/dL (ref >39.00)
NonHDL: 117.53
Total CHOL/HDL Ratio: 3
Triglycerides: 224 mg/dL — ABNORMAL HIGH (ref 0.0–149.0)
VLDL: 44.8 mg/dL — ABNORMAL HIGH (ref 0.0–40.0)

## 2020-11-24 LAB — COMPREHENSIVE METABOLIC PANEL WITH GFR
ALT: 51 U/L (ref 0–53)
AST: 48 U/L — ABNORMAL HIGH (ref 0–37)
Albumin: 4.7 g/dL (ref 3.5–5.2)
Alkaline Phosphatase: 64 U/L (ref 39–117)
BUN: 16 mg/dL (ref 6–23)
CO2: 25 meq/L (ref 19–32)
Calcium: 9.6 mg/dL (ref 8.4–10.5)
Chloride: 103 meq/L (ref 96–112)
Creatinine, Ser: 1.05 mg/dL (ref 0.40–1.50)
GFR: 81.89 mL/min
Glucose, Bld: 87 mg/dL (ref 70–99)
Potassium: 5.5 meq/L — ABNORMAL HIGH (ref 3.5–5.1)
Sodium: 138 meq/L (ref 135–145)
Total Bilirubin: 0.6 mg/dL (ref 0.2–1.2)
Total Protein: 7.4 g/dL (ref 6.0–8.3)

## 2020-11-24 LAB — LDL CHOLESTEROL, DIRECT: Direct LDL: 72 mg/dL

## 2020-11-24 LAB — HEMOGLOBIN A1C: Hgb A1c MFr Bld: 5.7 % (ref 4.6–6.5)

## 2020-11-24 MED ORDER — FLUTICASONE PROPIONATE 50 MCG/ACT NA SUSP: NASAL | 3 refills | Status: DC

## 2020-11-24 MED ORDER — OMEPRAZOLE 40 MG PO CPDR: DELAYED_RELEASE_CAPSULE | ORAL | 1 refills | Status: DC

## 2020-11-24 MED ORDER — BUPROPION HCL ER (XL) 150 MG PO TB24: 150.0000 mg | ORAL_TABLET | Freq: Every day | ORAL | 1 refills | Status: DC

## 2020-11-24 MED ORDER — FENOFIBRATE 48 MG PO TABS: ORAL_TABLET | ORAL | 1 refills | Status: DC

## 2020-11-24 MED ORDER — BUSPIRONE HCL 15 MG PO TABS: 15.0000 mg | ORAL_TABLET | Freq: Three times a day (TID) | ORAL | 1 refills | Status: DC

## 2020-11-24 MED ORDER — ATORVASTATIN CALCIUM 40 MG PO TABS: ORAL_TABLET | ORAL | 1 refills | Status: DC

## 2020-11-24 MED ORDER — AMLODIPINE BESYLATE 10 MG PO TABS: 10.0000 mg | ORAL_TABLET | Freq: Every day | ORAL | 3 refills | Status: DC

## 2020-11-24 NOTE — Progress Notes (Addendum)
Subjective:    Patient ID: Christopher Pace, male    DOB: 09/26/1970, 50 y.o.   MRN: [id number]  HPI  Pt in for pre-op exam today. Surgery for avascular necrosis pending. Pending surgery he states using hydrocodone sparingly.   Prior general anesthesia procedure no problems or reaction to anesthesia. No fh of early death. Negative ros except hip pain.   In addition to wellness exam pt had questions on chronic med problems.  Pt states he is out of all of his meds.I don't remember getting any refill requests.    Htn- pt bp is well controlled today.Event though he is not on medication.   High cholesterol and on atorvastatin 40 mg daily.  Anxiety and ran out of buspar.   Pt is trying to quit smoking. I had written bupropion. 1/4 pack a day. Was smoking more. He clarified that only smokes 1-2 cigarettes a day. Surgeon form wants him to quit.   Genella Rife is controlled when using omeprazole daily.     Review of Systems  Constitutional:  Negative for chills, fatigue and fever.  Respiratory:  Negative for cough, chest tightness, shortness of breath and wheezing.   Cardiovascular:  Negative for chest pain and palpitations.  Gastrointestinal:  Negative for abdominal pain.  Genitourinary:  Negative for dysuria.  Musculoskeletal:  Negative for back pain.       Rt hip pain.  Neurological:  Negative for dizziness, seizures, numbness and headaches.  Hematological:  Negative for adenopathy. Does not bruise/bleed easily.  Psychiatric/Behavioral:  Negative for behavioral problems and decreased concentration.     Past Medical History:  Diagnosis Date   Alcohol abuse 08/08/2014   Arthritis    Depression    Generalized anxiety disorder    GERD (gastroesophageal reflux disease)      Social History   Socioeconomic History   Marital status: Significant Other    Spouse name: Not on file   Number of children: Not on file   Years of education: Not on file   Highest education level: Not on  file  Occupational History   Not on file  Tobacco Use   Smoking status: Every Day   Smokeless tobacco: Never  Substance and Sexual Activity   Alcohol use: Yes    Alcohol/week: 0.0 standard drinks    Comment: Pt states 5 days a week some drinking. More than a six pack.   Drug use: Not on file   Sexual activity: Yes  Other Topics Concern   Not on file  Social History Narrative   Not on file   Social Determinants of Health   Financial Resource Strain: Not on file  Food Insecurity: Not on file  Transportation Needs: Not on file  Physical Activity: Not on file  Stress: Not on file  Social Connections: Not on file  Intimate Partner Violence: Not on file    Past Surgical History:  Procedure Laterality Date   KNEE SURGERY     Hardware Placed due yo break    No family history on file.  No Known Allergies  Current Outpatient Medications on File Prior to Visit  Medication Sig Dispense Refill   amLODipine (NORVASC) 10 MG tablet Take 1 tablet (10 mg total) daily by mouth. 30 tablet 0   amLODipine (NORVASC) 10 MG tablet Take 1 tablet (10 mg total) by mouth daily. 90 tablet 3   atorvastatin (LIPITOR) 40 MG tablet TAKE 1 TABLET BY MOUTH EVERY DAY 90 tablet 0   buPROPion (WELLBUTRIN XL)  150 MG 24 hr tablet Take 1 tablet (150 mg total) by mouth daily. 90 tablet 1   busPIRone (BUSPAR) 15 MG tablet Take 1 tablet (15 mg total) by mouth 2 (two) times daily. 60 tablet 0   busPIRone (BUSPAR) 15 MG tablet Take 1 tablet (15 mg total) by mouth 3 (three) times daily. 90 tablet 1   fenofibrate (TRICOR) 48 MG tablet TAKE 1 TABLET(48 MG) BY MOUTH DAILY 90 tablet 1   fluticasone (FLONASE) 50 MCG/ACT nasal spray SHAKE LIQUID AND USE 2 SPRAYS IN EACH NOSTRIL DAILY 16 g 3   HYDROcodone-acetaminophen (NORCO/VICODIN) 5-325 MG tablet Take 1 tablet by mouth every 8 (eight) hours as needed. 15 tablet 0   levocetirizine (XYZAL) 5 MG tablet Take 1 tablet (5 mg total) by mouth every evening. 30 tablet 3    omeprazole (PRILOSEC) 40 MG capsule TAKE 1 CAPSULE(40 MG) BY MOUTH DAILY 90 capsule 1   sildenafil (REVATIO) 20 MG tablet 2-5 tab po prior as needed prior to sex 50 tablet 3   sildenafil (VIAGRA) 100 MG tablet TAKE 1 TABLET(100 MG) BY MOUTH DAILY AS NEEDED FOR ERECTILE DYSFUNCTION 20 tablet 1   No current facility-administered medications on file prior to visit.    BP 113/83   Pulse 79   Resp 18   Ht 6\' 1"  (1.854 m)   Wt 206 lb (93.4 kg)   SpO2 98%   BMI 27.18 kg/m       Objective:   Physical Exam  General Mental Status- Alert. General Appearance- Not in acute distress.   Skin General: Color- Normal Color. Moisture- Normal Moisture.  Neck Carotid Arteries- Normal color. Moisture- Normal Moisture. No carotid bruits. No JVD.  Chest and Lung Exam Auscultation: Breath Sounds:-Normal.  Cardiovascular Auscultation:Rythm- Regular. Murmurs & Other Heart Sounds:Auscultation of the heart reveals- No Murmurs.  Abdomen Inspection:-Inspeection Normal. Palpation/Percussion:Note:No mass. Palpation and Percussion of the abdomen reveal- Non Tender, Non Distended + BS, no rebound or guarding.   Neurologic Cranial Nerve exam:- CN III-XII intact(No nystagmus), symmetric smile. Drift Test:- No drift. Romberg Exam:- Negative.  Heal to Toe Gait exam:-Normal. Finger to Nose:- Normal/Intact Strength:- 5/5 equal and symmetric strength both upper and lower extremities.       Assessment & Plan:   Patient Instructions  Preop physical exam today.  Normal exam.  EKG appears normal sinus rhythm(compared to prior in 2016 and appears machine). Machine read incomplete lbbb.   We will get preop CBC and metabolic panel.  Fill out form when labs are back   Recently all medications have been stopped.  I am not sure why refill request were not sent to our office?  Blood pressure well controlled today.  Since well-controlled and not on blood pressure medication can continue to be off amlodipine.   But recommend checking blood pressure couple times a week and verifying blood pressure less than 140/90.  Your improved diet and weight loss might have helped her blood pressure come under control.  GERD well-controlled with omeprazole.  Refill prescription.  Hyperlipidemia.  We will get lipid panel today.  Refilled her atorvastatin.  Anxiety controlled with BuSpar.  Refilled BuSpar today.  Report cutting back significantly on smoking.  I recommend stopping completely as orthopedic MD might not do surgery if not stopped completely.  Would recommend that she had that discussion with him.  Elevated sugars in the past.  Will get A1c.  Allergic rhinitis.  Refilling his Flonase.  Follow-up date to be determined after lab review.  Would asked her to follow-up at least every 6 months.   Esperanza Richters, PA-C   Time spent with patient today was  43 minutes which consisted of chart revdiew, discussing diagnosis, work up, treatment and documentation.   Note came in pre-op physical which included addressing all of his chronic medical problems and refillng all meds.

## 2020-11-24 NOTE — Patient Instructions (Addendum)
Preop physical exam today.  Normal exam.  EKG appears normal sinus rhythm(compared to prior in 2016 and appears machine). Machine read incomplete lbbb.   We will get preop CBC and metabolic panel.  Fill out form when labs are back   Recently all medications have been stopped.  I am not sure why refill request were not sent to our office?  Blood pressure well controlled today.  Since well-controlled and not on blood pressure medication can continue to be off amlodipine.  But recommend checking blood pressure couple times a week and verifying blood pressure less than 140/90.  Your improved diet and weight loss might have helped her blood pressure come under control.  GERD well-controlled with omeprazole.  Refill prescription.  Hyperlipidemia.  We will get lipid panel today.  Refilled her atorvastatin.  Anxiety controlled with BuSpar.  Refilled BuSpar today.  Report cutting back significantly on smoking.  I recommend stopping completely as orthopedic MD might not do surgery if not stopped completely.  Would recommend that she had that discussion with him.  Elevated sugars in the past.  Will get A1c.  Allergic rhinitis.  Refilling his Flonase.  Follow-up date to be determined after lab review.  Would asked her to follow-up at least every 6 months.

## 2020-12-01 ENCOUNTER — Telehealth: Payer: Self-pay | Admitting: Medical

## 2020-12-01 NOTE — Telephone Encounter (Signed)
Form faxed to sherry willis/ [facility]

## 2020-12-01 NOTE — Telephone Encounter (Signed)
Filled out preop risk form. Please fax over today. Placing on your desk.

## 2020-12-01 NOTE — Telephone Encounter (Signed)
Open to review  Esperanza Richters, PA-C

## 2020-12-02 DIAGNOSIS — M1611 Unilateral primary osteoarthritis, right hip: Secondary | ICD-10-CM | POA: Diagnosis not present

## 2020-12-14 DIAGNOSIS — M1611 Unilateral primary osteoarthritis, right hip: Secondary | ICD-10-CM | POA: Diagnosis not present

## 2021-03-15 ENCOUNTER — Other Ambulatory Visit: Payer: Self-pay | Admitting: Medical

## 2021-03-30 ENCOUNTER — Other Ambulatory Visit: Payer: Self-pay | Admitting: Medical

## 2021-06-02 ENCOUNTER — Other Ambulatory Visit: Payer: Self-pay | Admitting: Medical

## 2021-06-08 ENCOUNTER — Ambulatory Visit: Payer: BC Managed Care – PPO | Admitting: Medical

## 2021-06-08 ENCOUNTER — Encounter: Payer: Self-pay | Admitting: Medical

## 2021-06-08 VITALS — BP 130/78 | HR 72 | Resp 18 | Ht 73.0 in | Wt 220.0 lb

## 2021-06-08 DIAGNOSIS — E785 Hyperlipidemia, unspecified: Secondary | ICD-10-CM

## 2021-06-08 DIAGNOSIS — I10 Essential (primary) hypertension: Secondary | ICD-10-CM | POA: Diagnosis not present

## 2021-06-08 DIAGNOSIS — Z125 Encounter for screening for malignant neoplasm of prostate: Secondary | ICD-10-CM

## 2021-06-08 DIAGNOSIS — R739 Hyperglycemia, unspecified: Secondary | ICD-10-CM | POA: Diagnosis not present

## 2021-06-08 DIAGNOSIS — K219 Gastro-esophageal reflux disease without esophagitis: Secondary | ICD-10-CM | POA: Diagnosis not present

## 2021-06-08 DIAGNOSIS — F17200 Nicotine dependence, unspecified, uncomplicated: Secondary | ICD-10-CM

## 2021-06-08 LAB — COMPREHENSIVE METABOLIC PANEL
ALT: 37 U/L (ref 0–53)
AST: 25 U/L (ref 0–37)
Albumin: 4.7 g/dL (ref 3.5–5.2)
Alkaline Phosphatase: 63 U/L (ref 39–117)
BUN: 16 mg/dL (ref 6–23)
CO2: 27 mEq/L (ref 19–32)
Calcium: 9.8 mg/dL (ref 8.4–10.5)
Chloride: 102 mEq/L (ref 96–112)
Creatinine, Ser: 1.11 mg/dL (ref 0.40–1.50)
GFR: 76.32 mL/min (ref >60.00)
Glucose, Bld: 94 mg/dL (ref 70–99)
Potassium: 4.7 mEq/L (ref 3.5–5.1)
Sodium: 138 mEq/L (ref 135–145)
Total Bilirubin: 0.4 mg/dL (ref 0.2–1.2)
Total Protein: 7.1 g/dL (ref 6.0–8.3)

## 2021-06-08 LAB — LIPID PANEL
Cholesterol: 172 mg/dL (ref 0–200)
HDL: 59.2 mg/dL (ref >39.00)
NonHDL: 112.82
Total CHOL/HDL Ratio: 3
Triglycerides: 282 mg/dL — ABNORMAL HIGH (ref 0.0–149.0)
VLDL: 56.4 mg/dL — ABNORMAL HIGH (ref 0.0–40.0)

## 2021-06-08 LAB — HEMOGLOBIN A1C: Hgb A1c MFr Bld: 5.4 % (ref 4.6–6.5)

## 2021-06-08 LAB — LDL CHOLESTEROL, DIRECT: Direct LDL: 86 mg/dL

## 2021-06-08 LAB — PSA: PSA: 0.37 ng/mL (ref 0.10–4.00)

## 2021-06-08 MED ORDER — OMEPRAZOLE 40 MG PO CPDR: DELAYED_RELEASE_CAPSULE | ORAL | 3 refills | Status: AC

## 2021-06-08 MED ORDER — SILDENAFIL CITRATE 100 MG PO TABS: ORAL_TABLET | ORAL | 4 refills | Status: AC

## 2021-06-08 MED ORDER — BUSPIRONE HCL 15 MG PO TABS: 15.0000 mg | ORAL_TABLET | Freq: Three times a day (TID) | ORAL | 3 refills | Status: DC

## 2021-06-08 NOTE — Progress Notes (Signed)
? ?Subjective:  ? ? Patient ID: Christopher Pace, male    DOB: 23-Oct-1970, 51 y.o.   MRN: [id number] ? ?HPI ? ?Pt in for follow up. ? ?Pt had rt hip replacement since last saw him. Pain much improved now and states able to sleep. ? ?Pt states he is still anxious. He states severe. He ran out of refills. He states while on buspar 15 mg tid anxiety controlled. ? ?Genella Rife- symptoms controlled with omeprazole.  ? ?Htn- on amlodipine 10 mg daily. ? ?High cholesterol- on atorvastatin 40 mg daily and fenofibrate 48 mg daily. ? ? ? ? ?The 10-year ASCVD risk score (Arnett DK, et al., 2019) is: 8% ?  Values used to calculate the score: ?    Age: 23 years ?    Sex: Male ?    Is Non-Hispanic African American: No ?    Diabetic: No ?    Tobacco smoker: Yes ?    Systolic Blood Pressure: 140 mmHg ?    Is BP treated: Yes ?    HDL Cholesterol: 67.9 mg/dL ?    Total Cholesterol: 185 mg/dL  ? ? ? ?Pt is smoker but smoking less about pack a week only. I had rx'd wellbutrin for smoking cessation. ? ?ED- pt uses viagra. ? ?I have placed referral to gi for colonoscopy in past. Will place referral again. Pt will schedule the procedure. ? ? ?Review of Systems  ?Constitutional:  Negative for chills, fatigue and fever.  ?HENT:  Negative for congestion and ear discharge.   ?Respiratory:  Negative for cough, chest tightness, shortness of breath and wheezing.   ?Cardiovascular:  Negative for chest pain and palpitations.  ?Gastrointestinal:  Negative for abdominal distention, abdominal pain, blood in stool, constipation and diarrhea.  ?Genitourinary:  Negative for dysuria and flank pain.  ?Musculoskeletal:  Negative for back pain and gait problem.  ? ? ?Past Medical History:  ?Diagnosis Date  ? Alcohol abuse 08/08/2014  ? Arthritis   ? Depression   ? Generalized anxiety disorder   ? GERD (gastroesophageal reflux disease)   ? ?  ?Social History  ? ?Socioeconomic History  ? Marital status: Significant Other  ?  Spouse name: Not on file  ? Number of  children: Not on file  ? Years of education: Not on file  ? Highest education level: Not on file  ?Occupational History  ? Not on file  ?Tobacco Use  ? Smoking status: Every Day  ? Smokeless tobacco: Never  ?Substance and Sexual Activity  ? Alcohol use: Yes  ?  Alcohol/week: 0.0 standard drinks  ?  Comment: Pt states 5 days a week some drinking. More than a six pack.  ? Drug use: Not on file  ? Sexual activity: Yes  ?Other Topics Concern  ? Not on file  ?Social History Narrative  ? Not on file  ? ?Social Determinants of Health  ? ?Financial Resource Strain: Not on file  ?Food Insecurity: Not on file  ?Transportation Needs: Not on file  ?Physical Activity: Not on file  ?Stress: Not on file  ?Social Connections: Not on file  ?Intimate Partner Violence: Not on file  ? ? ?Past Surgical History:  ?Procedure Laterality Date  ? KNEE SURGERY    ? Hardware Placed due yo break  ? ? ?No family history on file. ? ?No Known Allergies ? ?Current Outpatient Medications on File Prior to Visit  ?Medication Sig Dispense Refill  ? amLODipine (NORVASC) 10 MG tablet TAKE 1 TABLET(10  MG) BY MOUTH DAILY 90 tablet 1  ? atorvastatin (LIPITOR) 40 MG tablet TAKE 1 TABLET BY MOUTH EVERY DAY 90 tablet 1  ? buPROPion (WELLBUTRIN XL) 150 MG 24 hr tablet TAKE 1 TABLET(150 MG) BY MOUTH DAILY 90 tablet 1  ? fenofibrate (TRICOR) 48 MG tablet TAKE 1 TABLET(48 MG) BY MOUTH DAILY 90 tablet 1  ? fluticasone (FLONASE) 50 MCG/ACT nasal spray SHAKE LIQUID AND USE 2 SPRAYS IN EACH NOSTRIL DAILY 16 g 3  ? HYDROcodone-acetaminophen (NORCO/VICODIN) 5-325 MG tablet Take 1 tablet by mouth every 8 (eight) hours as needed. 15 tablet 0  ? levocetirizine (XYZAL) 5 MG tablet Take 1 tablet (5 mg total) by mouth every evening. 30 tablet 3  ? omeprazole (PRILOSEC) 40 MG capsule TAKE 1 CAPSULE(40 MG) BY MOUTH DAILY 90 capsule 1  ? sildenafil (VIAGRA) 100 MG tablet TAKE 1 TABLET(100 MG) BY MOUTH DAILY AS NEEDED FOR ERECTILE DYSFUNCTION 20 tablet 1  ? ?No current  facility-administered medications on file prior to visit.  ? ? ?BP 140/90   Pulse 72   Resp 18   Ht 6\' 1"  (1.854 m)   Wt 220 lb (99.8 kg)   SpO2 99%   BMI 29.03 kg/m?  ?  ?   ?Objective:  ? Physical Exam ? ? ?General ?Mental Status- Alert. General Appearance- Not in acute distress.  ? ?Skin ?General: Color- Normal Color. Moisture- Normal Moisture. ? ?Neck ?Carotid Arteries- Normal color. Moisture- Normal Moisture. No carotid bruits. No JVD. ? ?Chest and Lung Exam ?Auscultation: ?Breath Sounds:-Normal. ? ?Cardiovascular ?Auscultation:Rythm- Regular. ?Murmurs & Other Heart Sounds:Auscultation of the heart reveals- No Murmurs. ? ?Abdomen ?Inspection:-Inspeection Normal. ?Palpation/Percussion:Note:No mass. Palpation and Percussion of the abdomen reveal- Non Tender, Non Distended + BS, no rebound or guarding. ? ? ?Neurologic ?Cranial Nerve exam:- CN III-XII intact(No nystagmus), symmetric smile. ?Strength:- 5/5 equal and symmetric strength both upper and lower extremities.  ? ?   ?Assessment & Plan:  ? ?Patient Instructions  ?Htn- your bp is better on recheck. on amlodipine 10 mg daily.  ? ?For anxiety controlled with buspar 15 mg tid. ? ?For gerd- continue omeprazole. Rx sent to pharmacy. ? ?For high cholesterol will get cmp and lipid panel today. Continue atorvastatin 40 mg daily and fenofibrate 48 mg daily. ? ?A1c for mild elevated sugar in past. ? ?For smoking cessation and to possibly help with attention increase your wellbutrin 150 xl to 2 tab daily. ? ?Psa screening. ? ?For ED refilled your viagra. ? ?Follow up 6 months or sooner if needed. ?  ? ? , PA-C  ?

## 2021-06-08 NOTE — Patient Instructions (Addendum)
Htn- your bp is better on recheck. on amlodipine 10 mg daily.  ? ?For anxiety controlled with buspar 15 mg tid. ? ?For gerd- continue omeprazole. Rx sent to pharmacy. ? ?For high cholesterol will get cmp and lipid panel today. Continue atorvastatin 40 mg daily and fenofibrate 48 mg daily. ? ?A1c for mild elevated sugar in past. ? ?For smoking cessation and to possibly help with attention increase your wellbutrin 150 xl to 2 tab daily. ? ?Psa screening. ? ?For ED refilled your viagra. ? ?Follow up 6 months or sooner if needed. ? ?

## 2021-06-20 ENCOUNTER — Other Ambulatory Visit: Payer: Self-pay | Admitting: Medical

## 2021-08-19 ENCOUNTER — Other Ambulatory Visit: Payer: Self-pay | Admitting: Medical

## 2022-01-16 DIAGNOSIS — J209 Acute bronchitis, unspecified: Secondary | ICD-10-CM | POA: Diagnosis not present

## 2022-01-18 ENCOUNTER — Telehealth: Payer: Self-pay

## 2022-01-18 NOTE — Telephone Encounter (Signed)
Patient Name: Christopher Pace Gender: Male DOB: 12-Jun-1970 Age: 51 Y 2 M 1 D Return Phone Number: [phone] (Primary), [phone] (Secondary) Client [name] Night - Client Client Site [name] - Night Provider [place], Ramon Dredge - [place] Contact Type Call Who Is Calling Patient / Member / Family / Caregiver Call Type Triage / Clinical Relationship To Patient Self Return Phone Number [phone] (Primary) Chief Complaint BREATHING - shortness of breath or sounds breathless Reason for Call Symptomatic / Request for Health Information Initial Comment Caller states his symptoms started over the weekend, he was seen in urgent care on Sunday and asking can he come in today as she's still having problems breathing/shortness of breath Translation No Nurse Assessment Nurse: Daphine Deutscher, RN, Cala Bradford Date/Time (Eastern Time): 01/18/2022 7:30:05 AM Confirm and document reason for call. If symptomatic, describe symptoms. ---caller states he was seen in the UC on sunday due to feeling sick- congestion, fever, cough, ribs hurt from coughing and SOB. he would like to be seen by his PCP. no fever. Does the patient have any new or worsening symptoms? ---Yes Will a triage be completed? ---Yes Related visit to physician within the last 2 weeks? ---Yes Does the PT have any chronic conditions? (i.e. diabetes, asthma, this includes High risk factors for pregnancy, etc.) ---Yes List chronic conditions. ---hyperlipidemia, acid reflux Is this a behavioral health or substance abuse call? ---No Guidelines Guideline Title Affirmed Question Affirmed Notes Nurse Date/Time (Eastern Time) Breathing Difficulty [1] MILD difficulty breathing (e.g., minimal/no SOB at rest, SOB with walking, pulse <100) AND [2] NEW-onset Abram Sander 01/18/2022 7:34:04 AM

## 2022-01-18 NOTE — Telephone Encounter (Signed)
Pt called and scheduled for Thursday due to personal schedule

## 2022-01-20 ENCOUNTER — Ambulatory Visit: Payer: BC Managed Care – PPO | Admitting: Medical

## 2022-01-20 ENCOUNTER — Encounter: Payer: Self-pay | Admitting: Medical

## 2022-01-20 ENCOUNTER — Ambulatory Visit (HOSPITAL_BASED_OUTPATIENT_CLINIC_OR_DEPARTMENT_OTHER)
Admission: RE | Admit: 2022-01-20 | Discharge: 2022-01-20 | Disposition: A | Discharge status: Home / Self Care | Payer: BC Managed Care – PPO | Source: Ambulatory Visit | Attending: Medical | Admitting: Medical

## 2022-01-20 VITALS — BP 130/80 | HR 73 | Ht 73.0 in | Wt 219.6 lb

## 2022-01-20 DIAGNOSIS — I10 Essential (primary) hypertension: Secondary | ICD-10-CM

## 2022-01-20 DIAGNOSIS — R9389 Abnormal findings on diagnostic imaging of other specified body structures: Secondary | ICD-10-CM

## 2022-01-20 DIAGNOSIS — J40 Bronchitis, not specified as acute or chronic: Secondary | ICD-10-CM

## 2022-01-20 DIAGNOSIS — F411 Generalized anxiety disorder: Secondary | ICD-10-CM

## 2022-01-20 DIAGNOSIS — R0602 Shortness of breath: Secondary | ICD-10-CM | POA: Diagnosis not present

## 2022-01-20 DIAGNOSIS — K219 Gastro-esophageal reflux disease without esophagitis: Secondary | ICD-10-CM | POA: Diagnosis not present

## 2022-01-20 DIAGNOSIS — R059 Cough, unspecified: Secondary | ICD-10-CM | POA: Diagnosis not present

## 2022-01-20 DIAGNOSIS — R5383 Other fatigue: Secondary | ICD-10-CM | POA: Diagnosis not present

## 2022-01-20 LAB — LIPID PANEL
Cholesterol: 147 mg/dL (ref 0–200)
HDL: 49.5 mg/dL (ref >39.00)
LDL Cholesterol: 62 mg/dL (ref 0–99)
NonHDL: 97.13
Total CHOL/HDL Ratio: 3
Triglycerides: 178 mg/dL — ABNORMAL HIGH (ref 0.0–149.0)
VLDL: 35.6 mg/dL (ref 0.0–40.0)

## 2022-01-20 LAB — COMPREHENSIVE METABOLIC PANEL
ALT: 39 U/L (ref 0–53)
AST: 28 U/L (ref 0–37)
Albumin: 4.2 g/dL (ref 3.5–5.2)
Alkaline Phosphatase: 51 U/L (ref 39–117)
BUN: 20 mg/dL (ref 6–23)
CO2: 30 mEq/L (ref 19–32)
Calcium: 9.3 mg/dL (ref 8.4–10.5)
Chloride: 102 mEq/L (ref 96–112)
Creatinine, Ser: 1.15 mg/dL (ref 0.40–1.50)
GFR: 72.83 mL/min (ref >60.00)
Glucose, Bld: 99 mg/dL (ref 70–99)
Potassium: 4.2 mEq/L (ref 3.5–5.1)
Sodium: 140 mEq/L (ref 135–145)
Total Bilirubin: 0.4 mg/dL (ref 0.2–1.2)
Total Protein: 7.2 g/dL (ref 6.0–8.3)

## 2022-01-20 LAB — CBC WITH DIFFERENTIAL/PLATELET
Basophils Absolute: 0 10*3/uL (ref 0.0–0.1)
Basophils Relative: 0.4 % (ref 0.0–3.0)
Eosinophils Absolute: 0.1 10*3/uL (ref 0.0–0.7)
Eosinophils Relative: 0.8 % (ref 0.0–5.0)
HCT: 43.9 % (ref 39.0–52.0)
Hemoglobin: 14.9 g/dL (ref 13.0–17.0)
Lymphocytes Relative: 35.1 % (ref 12.0–46.0)
Lymphs Abs: 2.4 10*3/uL (ref 0.7–4.0)
MCHC: 33.9 g/dL (ref 30.0–36.0)
MCV: 90.8 fl (ref 78.0–100.0)
Monocytes Absolute: 0.8 10*3/uL (ref 0.1–1.0)
Monocytes Relative: 12 % (ref 3.0–12.0)
Neutro Abs: 3.6 10*3/uL (ref 1.4–7.7)
Neutrophils Relative %: 51.7 % (ref 43.0–77.0)
Platelets: 207 10*3/uL (ref 150.0–400.0)
RBC: 4.84 Mil/uL (ref 4.22–5.81)
RDW: 13.7 % (ref 11.5–15.5)
WBC: 6.9 10*3/uL (ref 4.0–10.5)

## 2022-01-20 MED ORDER — FENOFIBRATE 48 MG PO TABS: ORAL_TABLET | ORAL | 3 refills | Status: AC

## 2022-01-20 MED ORDER — ATORVASTATIN CALCIUM 40 MG PO TABS: 40.0000 mg | ORAL_TABLET | Freq: Every day | ORAL | 3 refills | Status: AC

## 2022-01-20 MED ORDER — BUSPIRONE HCL 15 MG PO TABS: 15.0000 mg | ORAL_TABLET | Freq: Three times a day (TID) | ORAL | 3 refills | Status: AC

## 2022-01-20 NOTE — Patient Instructions (Addendum)
Bronchitis vs pneumonia recently based on  treatment given.(Started out viral syndrome like). Will get cbc, cmp and cxr stat. Continue current medications given by urgent care. No exercise for 5-7 days.   Htn- bp better today and you have been off meds. More healthy lifestyle recently. Check bp daily over next week. If you see bp over 140/90 let me know and will put you back on amlodipine.   Get lipid panel to check high cholesterol.  For anxiety-  refilled your buspar.  Since you are living most of time at [facility] need you to establish with provider there. I can see you when back in town periodically.  Follow up date to be determined after lab and xray review.

## 2022-01-20 NOTE — Progress Notes (Signed)
Subjective:    Patient ID: Christopher Pace, male    DOB: [birth year], 51 y.o.   MRN: [id number]  HPI  Pt in for follow up from urgent care. Pt can't give me name of UC. He had fever, cough, chest congestion, wheezing and muscle aches since Friday. He was seen on sunday. He took 2 covid test and negative. Urgent care did not swab his nose.   Pt was given promethazine dm, prednisone 20 mg 2 tab daily for 7 days and augmentin.   Pt lives now mostly in [facility] though has house locally.   I don't see any records in epic. He feels about 75%. Still has some productive. Breathing better/less sob but note last night going up steps felt little short of breath.  He wants to have blood work done.   Htn- bp is good today.   Genella Rife- still having some periodically.    Review of Systems  Constitutional:  Positive for fatigue. Negative for chills.  HENT:  Negative for congestion and ear discharge.   Respiratory:  Positive for cough. Negative for chest tightness and wheezing.        Mild sob but better than last week.  Cardiovascular:  Negative for chest pain.  Gastrointestinal:  Negative for abdominal pain.  Musculoskeletal:  Negative for back pain.  Neurological:  Negative for dizziness, seizures, syncope, weakness and light-headedness.  Hematological:  Negative for adenopathy. Does not bruise/bleed easily.  Psychiatric/Behavioral:  Negative for behavioral problems and decreased concentration.     Past Medical History:  Diagnosis Date   Alcohol abuse 08/08/2014   Arthritis    Depression    Generalized anxiety disorder    GERD (gastroesophageal reflux disease)      Social History   Socioeconomic History   Marital status: Significant Other    Spouse name: Not on file   Number of children: Not on file   Years of education: Not on file   Highest education level: Not on file  Occupational History   Not on file  Tobacco Use   Smoking status: Every Day   Smokeless tobacco: Never   Substance and Sexual Activity   Alcohol use: Yes    Alcohol/week: 0.0 standard drinks of alcohol    Comment: Pt states 5 days a week some drinking. More than a six pack.   Drug use: Not on file   Sexual activity: Yes  Other Topics Concern   Not on file  Social History Narrative   Not on file   Social Determinants of Health   Financial Resource Strain: Not on file  Food Insecurity: Not on file  Transportation Needs: Not on file  Physical Activity: Not on file  Stress: Not on file  Social Connections: Not on file  Intimate Partner Violence: Not on file    Past Surgical History:  Procedure Laterality Date   KNEE SURGERY     Hardware Placed due yo break    No family history on file.  No Known Allergies  Current Outpatient Medications on File Prior to Visit  Medication Sig Dispense Refill   atorvastatin (LIPITOR) 40 MG tablet TAKE 1 TABLET BY MOUTH EVERY DAY 90 tablet 1   buPROPion (WELLBUTRIN XL) 150 MG 24 hr tablet TAKE 1 TABLET(150 MG) BY MOUTH DAILY 90 tablet 1   busPIRone (BUSPAR) 15 MG tablet Take 1 tablet (15 mg total) by mouth 3 (three) times daily. 90 tablet 3   fenofibrate (TRICOR) 48 MG tablet TAKE 1 TABLET(48  MG) BY MOUTH DAILY 90 tablet 1   sildenafil (VIAGRA) 100 MG tablet 1 tab po prior to sex 20 tablet 4   amLODipine (NORVASC) 10 MG tablet TAKE 1 TABLET(10 MG) BY MOUTH DAILY (Patient not taking: Reported on 01/20/2022) 90 tablet 1   fluticasone (FLONASE) 50 MCG/ACT nasal spray SHAKE LIQUID AND USE 2 SPRAYS IN EACH NOSTRIL DAILY (Patient not taking: Reported on 01/20/2022) 16 g 3   HYDROcodone-acetaminophen (NORCO/VICODIN) 5-325 MG tablet Take 1 tablet by mouth every 8 (eight) hours as needed. (Patient not taking: Reported on 01/20/2022) 15 tablet 0   levocetirizine (XYZAL) 5 MG tablet Take 1 tablet (5 mg total) by mouth every evening. (Patient not taking: Reported on 01/20/2022) 30 tablet 3   omeprazole (PRILOSEC) 40 MG capsule TAKE 1 CAPSULE(40 MG) BY MOUTH  DAILY (Patient not taking: Reported on 01/20/2022) 90 capsule 3   No current facility-administered medications on file prior to visit.    BP 130/80   Pulse 73   Ht 6\' 1"  (1.854 m)   Wt 219 lb 9.6 oz (99.6 kg)   SpO2 97%   BMI 28.97 kg/m        Objective:   Physical Exam  General Mental Status- Alert. General Appearance- Not in acute distress.   Skin General: Color- Normal Color. Moisture- Normal Moisture.  Neck Carotid Arteries- Normal color. Moisture- Normal Moisture. No carotid bruits. No JVD.  Chest and Lung Exam Auscultation: Breath Sounds:-Normal.  Cardiovascular Auscultation:Rythm- Regular. Murmurs & Other Heart Sounds:Auscultation of the heart reveals- No Murmurs.  Abdomen Inspection:-Inspeection Normal. Palpation/Percussion:Note:No mass. Palpation and Percussion of the abdomen reveal- Non Tender, Non Distended + BS, no rebound or guarding.   Neurologic Cranial Nerve exam:- CN III-XII intact(No nystagmus), symmetric smile. Strength:- 5/5 equal and symmetric strength both upper and lower extremities.   Lower ext- calfs symmetric, no pedal edama. Negative homans signs.     Assessment & Plan:   Patient Instructions  Bronchitis vs pneumonia recently based treatment given.(Started out viral syndrome like). Will get cbc, cmp and cxr stat. Continue current medications given by urgent care. No exercise for 5-7 days.   Htn- bp better today and you have been off meds. More healthy lifestyle recently. Check bp daily over next week. If you see bp over 140/90 let me know and will put you back on amlodipine.   Get lipid panel to check high cholesterol.  For anxiety-  refilled your buspar.  Since you are living most of time at [facility] need you to establish with provider there. I can see you when back in town periodically.  Follow up date to be determined after lab and xray review.     [facility], PA-C

## 2022-01-20 NOTE — Addendum Note (Signed)
Addended by: Gwenevere Abbot on: 01/20/2022 07:42 PM   Modules accepted: Orders

## 2022-01-20 NOTE — Addendum Note (Signed)
Addended by: Gwenevere Abbot on: 01/20/2022 07:50 PM   Modules accepted: Orders

## 2022-03-17 IMAGING — DX DG HIP (WITH OR WITHOUT PELVIS) 2-3V*R*
3 series · 3 of 3 positions shown · non-contrast
Comparison: None.

CLINICAL DATA: Right hip pain for 2 weeks

EXAM:
DG HIP (WITH OR WITHOUT PELVIS) 2-3V RIGHT

[hip ap]
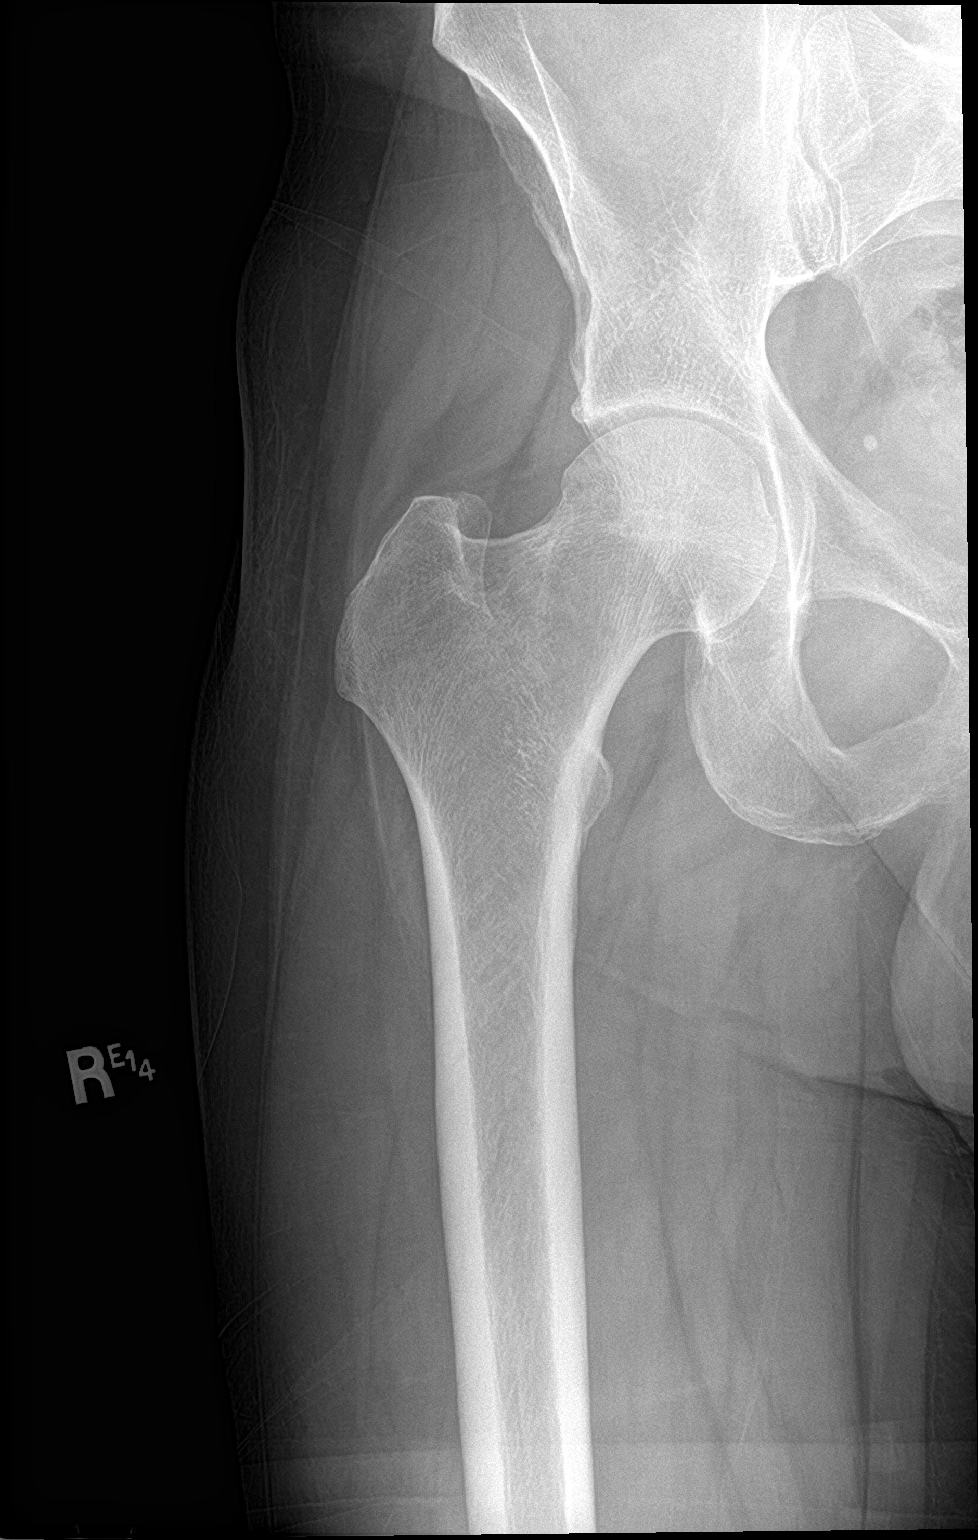

[hip lat]
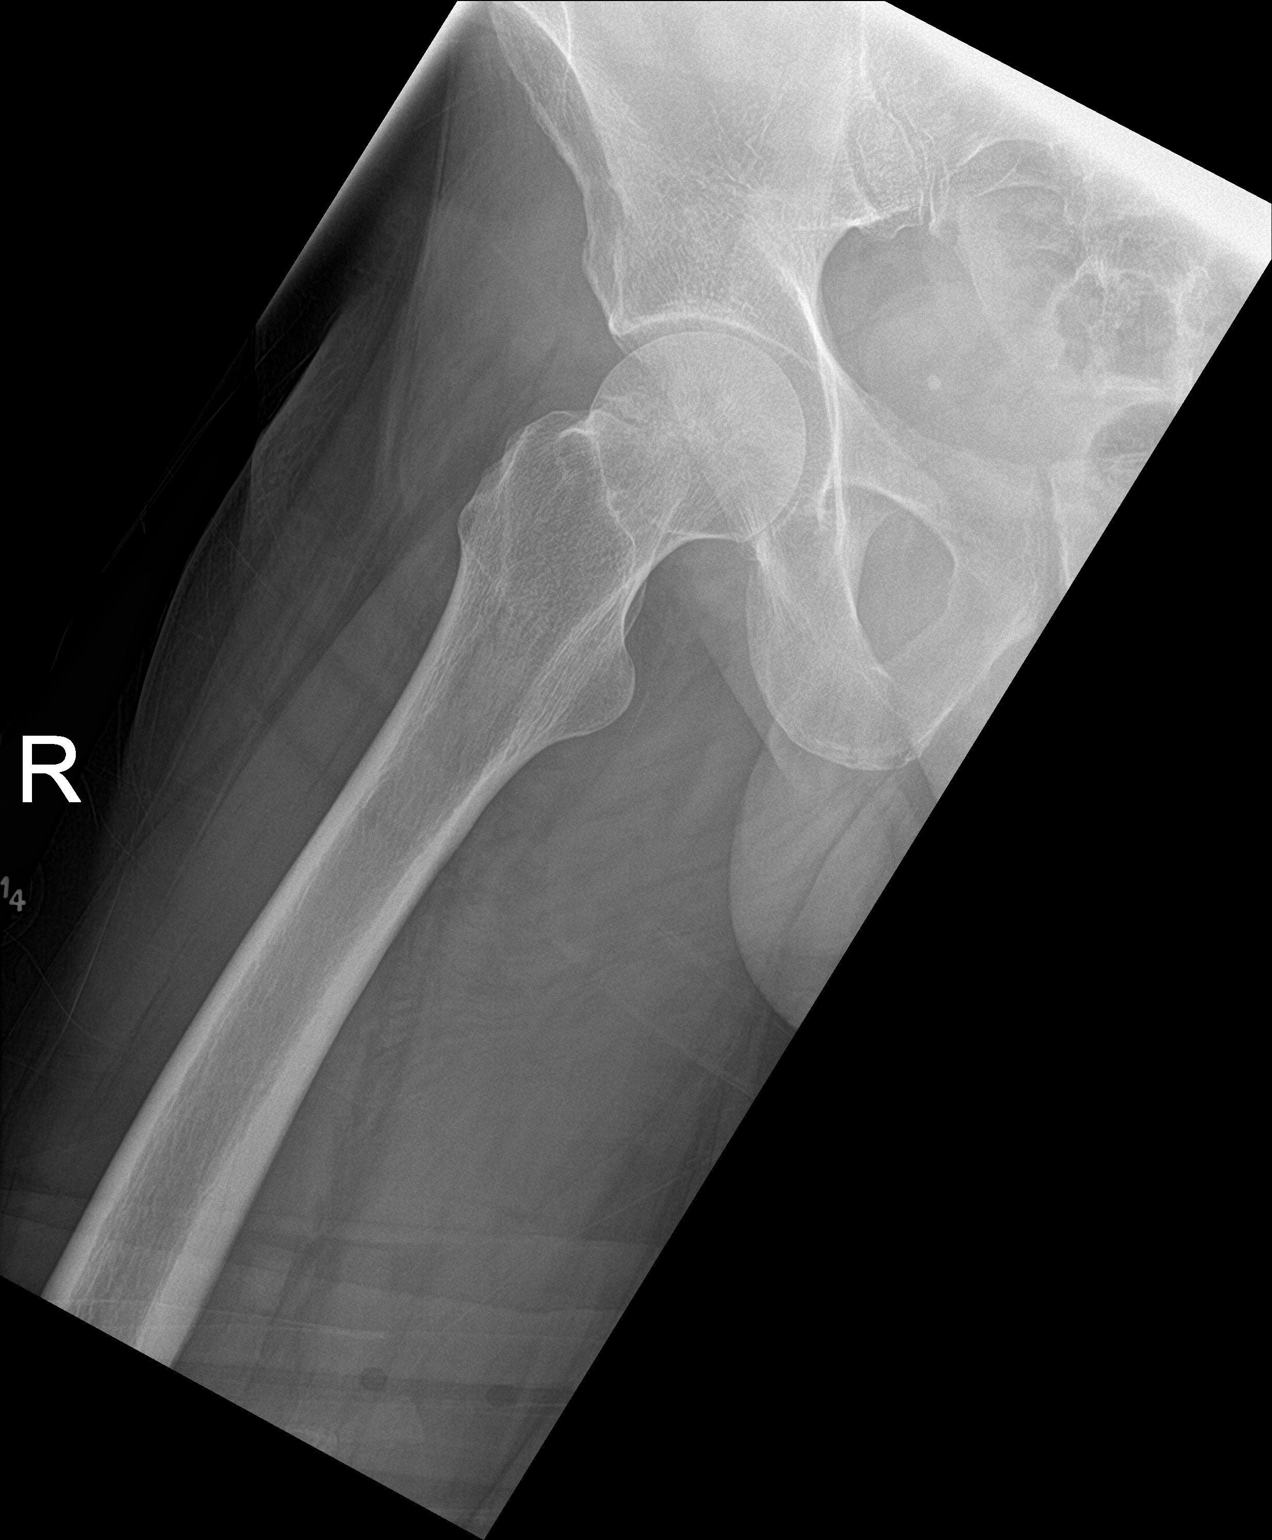

[pelvis ap]
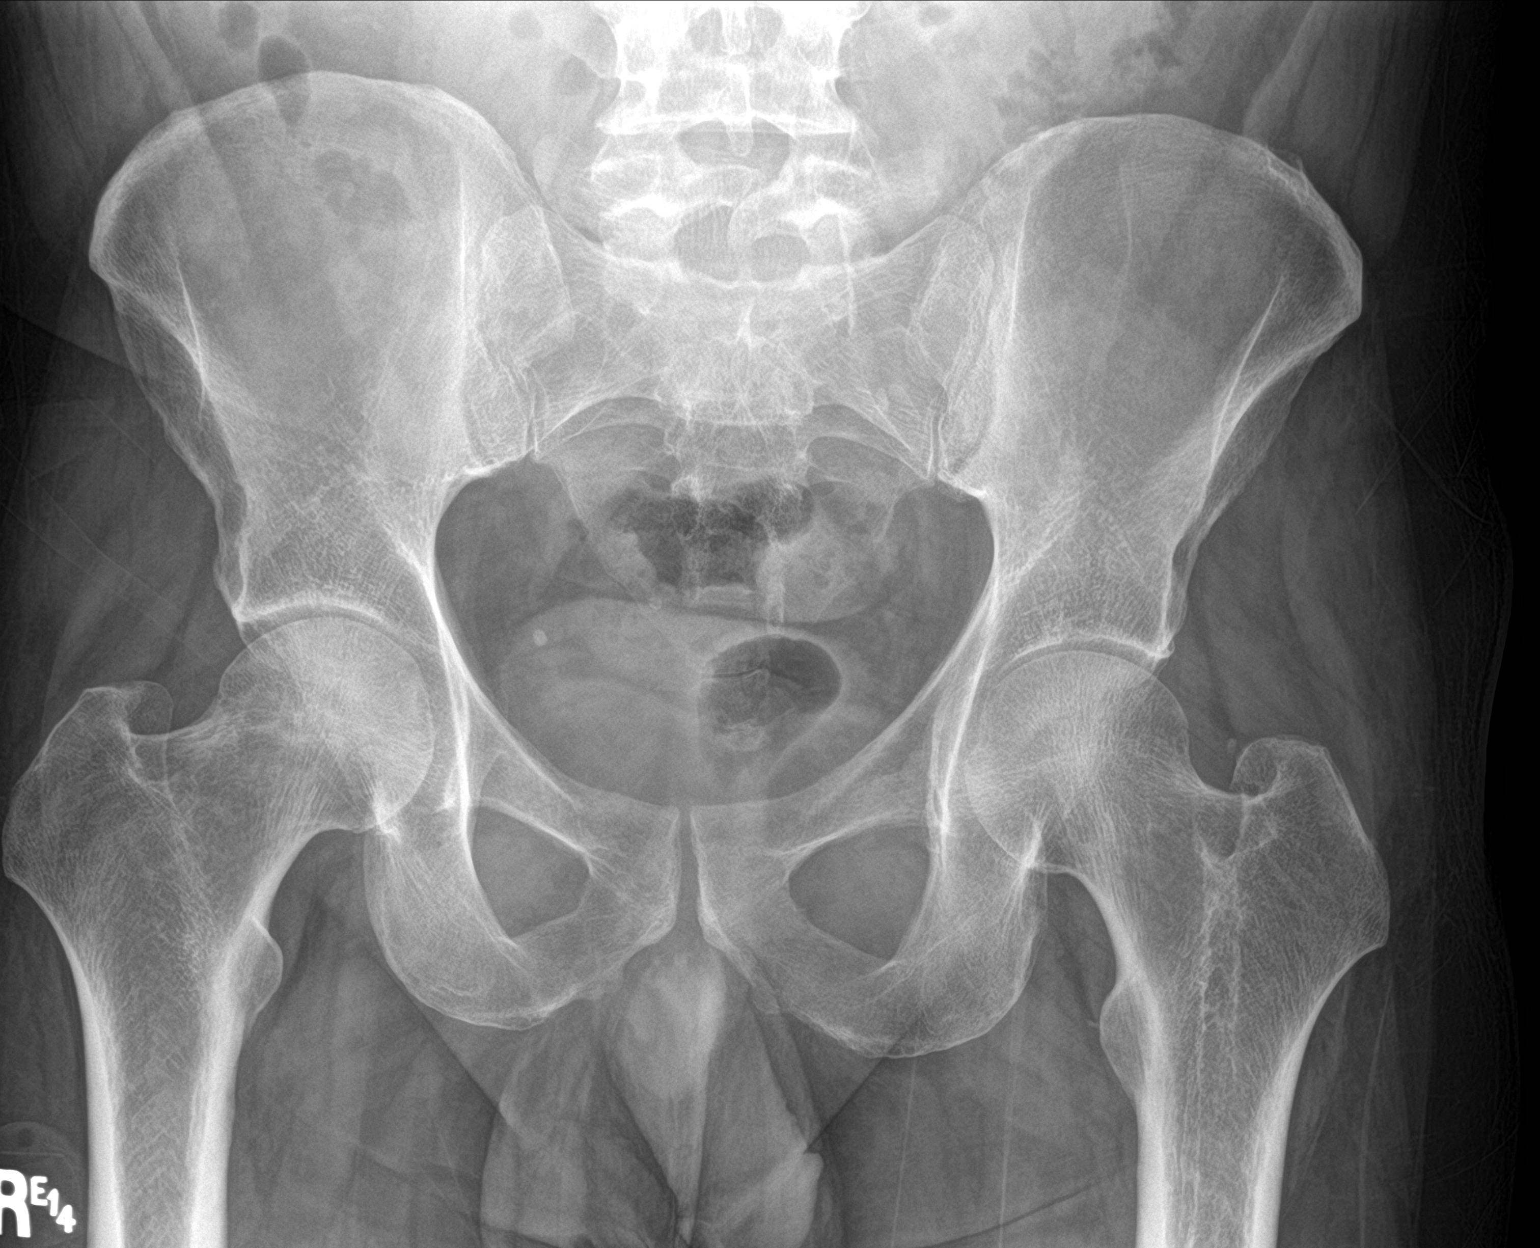

[3 of 3 positions shown; findings below may reference images not displayed]

FINDINGS: There is no evidence of hip fracture or dislocation. There is no
evidence of arthropathy or other focal bone abnormality.
IMPRESSION: No fracture or dislocation of the right hip. Joint spaces are
preserved.

## 2022-05-09 ENCOUNTER — Encounter: Payer: Self-pay | Admitting: *Deleted

## 2022-07-05 DIAGNOSIS — H532 Diplopia: Secondary | ICD-10-CM | POA: Diagnosis not present

## 2022-07-18 DIAGNOSIS — R739 Hyperglycemia, unspecified: Secondary | ICD-10-CM | POA: Diagnosis not present

## 2022-07-18 DIAGNOSIS — F330 Major depressive disorder, recurrent, mild: Secondary | ICD-10-CM | POA: Diagnosis not present

## 2022-07-18 DIAGNOSIS — Z1329 Encounter for screening for other suspected endocrine disorder: Secondary | ICD-10-CM | POA: Diagnosis not present

## 2022-07-18 DIAGNOSIS — E782 Mixed hyperlipidemia: Secondary | ICD-10-CM | POA: Diagnosis not present

## 2022-07-18 DIAGNOSIS — I10 Essential (primary) hypertension: Secondary | ICD-10-CM | POA: Diagnosis not present

## 2022-07-18 DIAGNOSIS — Z125 Encounter for screening for malignant neoplasm of prostate: Secondary | ICD-10-CM | POA: Diagnosis not present

## 2022-07-18 DIAGNOSIS — Z0000 Encounter for general adult medical examination without abnormal findings: Secondary | ICD-10-CM | POA: Diagnosis not present

## 2022-07-18 DIAGNOSIS — H538 Other visual disturbances: Secondary | ICD-10-CM | POA: Diagnosis not present

## 2022-07-18 DIAGNOSIS — R42 Dizziness and giddiness: Secondary | ICD-10-CM | POA: Diagnosis not present

## 2022-07-18 DIAGNOSIS — Z1330 Encounter for screening examination for mental health and behavioral disorders, unspecified: Secondary | ICD-10-CM | POA: Diagnosis not present

## 2022-07-18 DIAGNOSIS — F902 Attention-deficit hyperactivity disorder, combined type: Secondary | ICD-10-CM | POA: Diagnosis not present

## 2022-07-20 DIAGNOSIS — W540XXA Bitten by dog, initial encounter: Secondary | ICD-10-CM | POA: Diagnosis not present

## 2022-07-20 DIAGNOSIS — S51852A Open bite of left forearm, initial encounter: Secondary | ICD-10-CM | POA: Diagnosis not present

## 2022-07-30 DIAGNOSIS — Z4802 Encounter for removal of sutures: Secondary | ICD-10-CM | POA: Diagnosis not present

## 2022-07-30 DIAGNOSIS — S51852D Open bite of left forearm, subsequent encounter: Secondary | ICD-10-CM | POA: Diagnosis not present

## 2022-08-10 DIAGNOSIS — H538 Other visual disturbances: Secondary | ICD-10-CM | POA: Diagnosis not present

## 2022-08-10 DIAGNOSIS — F17210 Nicotine dependence, cigarettes, uncomplicated: Secondary | ICD-10-CM | POA: Diagnosis not present

## 2022-08-10 DIAGNOSIS — R42 Dizziness and giddiness: Secondary | ICD-10-CM | POA: Diagnosis not present
